# Patient Record
Sex: Female | Born: 1995
Health system: Southern US, Community
[De-identification: ages and names within clinical notes are randomized; demographics above are authoritative.]

## PROBLEM LIST (undated history)

## (undated) DIAGNOSIS — Z9109 Other allergy status, other than to drugs and biological substances: Secondary | ICD-10-CM

## (undated) DIAGNOSIS — T753XXA Motion sickness, initial encounter: Secondary | ICD-10-CM

## (undated) HISTORY — PX: DENTAL SURGERY: SHX609

---
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
## 2017-06-07 ENCOUNTER — Emergency Department
Admission: EM | Admit: 2017-06-07 | Discharge: 2017-06-07 | Disposition: A | Discharge status: Home / Self Care | Payer: Federal, State, Local not specified - PPO | Attending: Emergency Medicine | Admitting: Emergency Medicine

## 2017-06-07 ENCOUNTER — Encounter: Payer: Self-pay | Admitting: *Deleted

## 2017-06-07 DIAGNOSIS — J029 Acute pharyngitis, unspecified: Secondary | ICD-10-CM | POA: Insufficient documentation

## 2017-06-07 MED ORDER — DEXAMETHASONE SODIUM PHOSPHATE 10 MG/ML IJ SOLN
10.0000 mg | Freq: Once | INTRAMUSCULAR | Status: AC
  Administered 2017-06-07: 10 mg via INTRAMUSCULAR
  Filled 2017-06-07: qty 1

## 2017-06-07 MED ORDER — AMOXICILLIN 500 MG PO CAPS
500.0000 mg | ORAL_CAPSULE | Freq: Once | ORAL | Status: AC
  Administered 2017-06-07: 500 mg via ORAL
  Filled 2017-06-07: qty 1

## 2017-06-07 MED ORDER — LIDOCAINE VISCOUS 2 % MT SOLN: 10.0000 mL | OROMUCOSAL | 0 refills | Status: DC | PRN

## 2017-06-07 MED ORDER — AMOXICILLIN 500 MG PO CAPS
500.0000 mg | ORAL_CAPSULE | Freq: Two times a day (BID) | ORAL | 0 refills | Status: DC
Start: 2017-06-07 — End: 2017-10-24

## 2017-06-07 MED ORDER — LIDOCAINE VISCOUS 2 % MT SOLN
15.0000 mL | Freq: Once | OROMUCOSAL | Status: AC
  Administered 2017-06-07: 15 mL via OROMUCOSAL
  Filled 2017-06-07: qty 15

## 2017-06-07 NOTE — ED Notes (Signed)
Pt c/o sore throat for the past 2 days, worse on the right side with muffled voice.. [facility] and was referred to the ed for possible tonsillar abscess..denies any difficulty breathing, pt is able to swallow medications without difficulty..Marland Kitchen

## 2017-06-07 NOTE — ED Provider Notes (Signed)
[facility] Emergency Department Provider Note   ____________________________________________    I have reviewed the triage vital signs and the nursing notes.   HISTORY  Chief Complaint Sore Throat     HPI Kerri Moss is a 21 y.o. female Who presents with moderate burningsore throat.patient reports sore throat developed yesterday, before that she felt quite well. She reports painful swallowing but is able to tolerate by mouth's. Sent from urgent care for evaluation for possible peritonsillar abscess. She has not been on any medications.   History reviewed. No pertinent past medical history.  There are no active problems to display for this patient.   History reviewed. No pertinent surgical history.  Prior to Admission medications   Medication Sig Start Date End Date Taking? Authorizing Provider  amoxicillin (AMOXIL) 500 MG capsule Take 1 capsule (500 mg total) by mouth 2 (two) times daily. 06/07/17   Jene Every, MD  lidocaine (XYLOCAINE) 2 % solution Use as directed 10 mLs in the mouth or throat as needed for mouth pain. 06/07/17   Jene Every, MD     Allergies Patient has no known allergies.  History reviewed. No pertinent family history.  Social History Social History  Substance Use Topics  . Smoking status: Not on file  . Smokeless tobacco: Not on file  . Alcohol use Not on file    Review of Systems  Constitutional: No fever/chills  ENT: sore throat as above   Gastrointestinal: No abdominal pain.  No nausea, no vomiting.    Musculoskeletal: Negative for joint pain Skin: Negative for rash. Neurological: Negative for headaches     ____________________________________________   PHYSICAL EXAM:  VITAL SIGNS: ED Triage Vitals  Enc Vitals Group     BP 06/07/17 1004 140/71     Pulse Rate 06/07/17 1004 (!) 106     Resp 06/07/17 1004 18     Temp 06/07/17 1004 100.2 F (37.9 C)     Temp Source 06/07/17 1004 Oral     SpO2 06/07/17 1004 100 %     Weight 06/07/17 1004 72.6 kg (160 lb)     Height 06/07/17 1004 1.676 m ( )     Head Circumference --      Peak Flow --      Pain Score 06/07/17 1003 8     Pain Loc --      Pain Edu? --      Excl. in GC? --     Constitutional: Alert and oriented. No acute distress. Pleasant and interactive Eyes: Conjunctivae are normal.  Head: Atraumatic. Nose: No congestion/rhinnorhea. Mouth/Throat: Mucous membranes are moist.  Pharynx with bilateral tonsillar swelling, no evidence of PTA, some exudate on the right tonsil especially. No evidence of peritonsillar cellulitis Cardiovascular: Normal rate, regular rhythm.  Respiratory: Normal respiratory effort.  No retractions. Genitourinary: deferred Musculoskeletal: No lower extremity tenderness nor edema.   Neurologic:  Normal speech and language. No gross focal neurologic deficits are appreciated.   Skin:  Skin is warm, dry and intact. No rash noted.   ____________________________________________   LABS (all labs ordered are listed, but only abnormal results are displayed)  Labs Reviewed - No data to display ____________________________________________  EKG   ____________________________________________  RADIOLOGY  None ____________________________________________   PROCEDURES  Procedure(s) performed:No    Critical Care performed: No ____________________________________________   INITIAL IMPRESSION / ASSESSMENT AND PLAN / ED COURSE  Pertinent labs & imaging results that were available during my care of the patient  were reviewed by me and considered in my medical decision making (see chart for details).  Patient presents with sore throat most consistent with pharyngitis. Suspect strep throat, patient reports urgent care attempted to swab her throat and it was very painful for her. She does not want it done again. We will treat presumptively. IM Decadron and viscous lidocaine for  pain   ____________________________________________   FINAL CLINICAL IMPRESSION(S) / ED DIAGNOSES  Final diagnoses:  Pharyngitis, unspecified etiology      NEW MEDICATIONS STARTED DURING THIS VISIT:  New Prescriptions   AMOXICILLIN (AMOXIL) 500 MG CAPSULE    Take 1 capsule (500 mg total) by mouth 2 (two) times daily.   LIDOCAINE (XYLOCAINE) 2 % SOLUTION    Use as directed 10 mLs in the mouth or throat as needed for mouth pain.     Note:  This document was prepared using Dragon voice recognition software and may include unintentional dictation errors.    Jene EveryKinner, Vasilios Ottaway, MD 06/07/17 1032

## 2017-06-07 NOTE — ED Triage Notes (Signed)
[facility], full voice noted, pt able to swallow secretions, in no distress

## 2017-07-29 NOTE — Discharge Instructions (Signed)
T & A INSTRUCTION SHEET - [facility] °[facility] ° °CREIGHTON VAUGHT, MD °PAUL H. JUENGEL, MD  °[facility] °CHAPMAN MCQUEEN, MD ° °[address] [place], [place] [address] TEL. [phone] °[address] [place] [address] [phone] ° °INFORMATION SHEET FOR A TONSILLECTOMY AND ADENDOIDECTOMY ° °About Your Tonsils and Adenoids ° The tonsils and adenoids are normal body tissues that are part of our immune system.  They normally help to protect us against diseases that may enter our mouth and nose.  However, sometimes the tonsils and/or adenoids become too large and obstruct our breathing, especially at night. °  ° If either of these things happen it helps to remove the tonsils and adenoids in order to become healthier. The operation to remove the tonsils and adenoids is called a tonsillectomy and adenoidectomy. ° °The Location of Your Tonsils and Adenoids ° The tonsils are located in the back of the throat on both side and sit in a cradle of muscles. The adenoids are located in the roof of the mouth, behind the nose, and closely associated with the opening of the Eustachian tube to the ear. ° °Surgery on Tonsils and Adenoids ° A tonsillectomy and adenoidectomy is a short operation which takes about thirty minutes.  This includes being put to sleep and being awakened.  Tonsillectomies and adenoidectomies are performed at [facility] and may require observation period in the recovery room prior to going home. ° °Following the Operation for a Tonsillectomy ° A cautery machine is used to control bleeding.  Bleeding from a tonsillectomy and adenoidectomy is minimal and postoperatively the risk of bleeding is approximately four percent, although this rarely life threatening. ° ° ° °After your tonsillectomy and adenoidectomy post-op care at home: ° °1. Our patients are able to go home the same day.  You may be given prescriptions for pain  medications and antibiotics, if indicated. °2. It is extremely important to remember that fluid intake is of utmost importance after a tonsillectomy.  The amount that you drink must be maintained in the postoperative period.  A good indication of whether a child is getting enough fluid is whether his/her urine output is constant.  As long as children are urinating or wetting their diaper every 6 - 8 hours this is usually enough fluid intake.   °3. Although rare, this is a risk of some bleeding in the first ten days after surgery.  This is usually occurs between day five and nine postoperatively.  This risk of bleeding is approximately four percent.  If you or your child should have any bleeding you should remain calm and notify our office or go directly to the Emergency Room at [facility] where they will contact us. Our doctors are available seven days a week for notification.  We recommend sitting up quietly in a chair, place an ice pack on the front of the neck and spitting out the blood gently until we are able to contact you.  Adults should gargle gently with ice water and this may help stop the bleeding.  If the bleeding does not stop after a short time, i.e. 10 to 15 minutes, or seems to be increasing again, please contact us or go to the hospital.   °4. It is common for the pain to be worse at 5 - 7 days postoperatively.  This occurs because the “scab” is peeling off and the mucous membrane (skin of   the throat) is growing back where the tonsils were.   °5. It is common for a low-grade fever, less than 102, during the first week after a tonsillectomy and adenoidectomy.  It is usually due to not drinking enough liquids, and we suggest your use liquid Tylenol or the pain medicine with Tylenol prescribed in order to keep your temperature below 102.  Please follow the directions on the back of the bottle. °6. Do not take aspirin or any products that contain aspirin such as Bufferin, Anacin,  Ecotrin, aspirin gum, Goodies, BC headache powders, etc., after a T&A because it can promote bleeding.  Please check with our office before administering any other medication that may been prescribed by other doctors during the two week post-operative period. °7. If you happen to look in the mirror or into your child’s mouth you will see white/gray patches on the back of the throat.  This is what a scab looks like in the mouth and is normal after having a T&A.  It will disappear once the tonsil area heals completely. However, it may cause a noticeable odor, and this too will disappear with time.     °8. You or your child may experience ear pain after having a T&A.  This is called referred pain and comes from the throat, but it is felt in the ears.  Ear pain is quite common and expected.  It will usually go away after ten days.  There is usually nothing wrong with the ears, and it is primarily due to the healing area stimulating the nerve to the ear that runs along the side of the throat.  Use either the prescribed pain medicine or Tylenol as needed.  °9. The throat tissues after a tonsillectomy are obviously sensitive.  Smoking around children who have had a tonsillectomy significantly increases the risk of bleeding.  DO NOT SMOKE!  ° °General Anesthesia, Adult, Care After °These instructions provide you with information about caring for yourself after your procedure. Your health care provider may also give you more specific instructions. Your treatment has been planned according to current medical practices, but problems sometimes occur. Call your health care provider if you have any problems or questions after your procedure. °What can I expect after the procedure? °After the procedure, it is common to have: °· Vomiting. °· A sore throat. °· Mental slowness. ° °It is common to feel: °· Nauseous. °· Cold or shivery. °· Sleepy. °· Tired. °· Sore or achy, even in parts of your body where you did not have  surgery. ° °Follow these instructions at home: °For at least 24 hours after the procedure: °· Do not: °? Participate in activities where you could fall or become injured. °? Drive. °? Use heavy machinery. °? Drink alcohol. °? Take sleeping pills or medicines that cause drowsiness. °? Make important decisions or sign legal documents. °? Take care of children on your own. °· Rest. °Eating and drinking °· If you vomit, drink water, juice, or soup when you can drink without vomiting. °· Drink enough fluid to keep your urine clear or pale yellow. °· Make sure you have little or no nausea before eating solid foods. °· Follow the diet recommended by your health care provider. °General instructions °· Have a responsible adult stay with you until you are awake and alert. °· Return to your normal activities as told by your health care provider. Ask your health care provider what activities are safe for you. °· Take over-the-counter and   prescription medicines only as told by your health care provider. °· If you smoke, do not smoke without supervision. °· Keep all follow-up visits as told by your health care provider. This is important. °Contact a health care provider if: °· You continue to have nausea or vomiting at home, and medicines are not helpful. °· You cannot drink fluids or start eating again. °· You cannot urinate after 8-12 hours. °· You develop a skin rash. °· You have fever. °· You have increasing redness at the site of your procedure. °Get help right away if: °· You have difficulty breathing. °· You have chest pain. °· You have unexpected bleeding. °· You feel that you are having a life-threatening or urgent problem. °This information is not intended to replace advice given to you by your health care provider. Make sure you discuss any questions you have with your health care provider. °Document Released: 12/21/2000 Document Revised: 02/17/2016 Document Reviewed: 08/29/2015 °Elsevier Interactive Patient Education  © 2018 [employer] ° °

## 2017-08-06 ENCOUNTER — Ambulatory Visit: Payer: Federal, State, Local not specified - PPO | Admitting: Anesthesiology

## 2017-08-06 ENCOUNTER — Ambulatory Visit
Admission: RE | Admit: 2017-08-06 | Discharge: 2017-08-06 | Disposition: A | Discharge status: Home / Self Care | Payer: Federal, State, Local not specified - PPO | Source: Ambulatory Visit | Attending: Unknown Physician Specialty | Admitting: Unknown Physician Specialty

## 2017-08-06 ENCOUNTER — Encounter
Admission: RE | Disposition: A | Discharge status: Home / Self Care | Payer: Self-pay | Source: Ambulatory Visit | Attending: Unknown Physician Specialty

## 2017-08-06 DIAGNOSIS — J353 Hypertrophy of tonsils with hypertrophy of adenoids: Secondary | ICD-10-CM | POA: Insufficient documentation

## 2017-08-06 DIAGNOSIS — Z7951 Long term (current) use of inhaled steroids: Secondary | ICD-10-CM | POA: Insufficient documentation

## 2017-08-06 DIAGNOSIS — J351 Hypertrophy of tonsils: Secondary | ICD-10-CM | POA: Diagnosis present

## 2017-08-06 DIAGNOSIS — J039 Acute tonsillitis, unspecified: Secondary | ICD-10-CM | POA: Diagnosis not present

## 2017-08-06 HISTORY — DX: Other allergy status, other than to drugs and biological substances: Z91.09

## 2017-08-06 HISTORY — PX: TONSILLECTOMY AND ADENOIDECTOMY: SHX28

## 2017-08-06 HISTORY — DX: Motion sickness, initial encounter: T75.3XXA

## 2017-08-06 SURGERY — TONSILLECTOMY AND ADENOIDECTOMY
Anesthesia: General

## 2017-08-06 MED ORDER — FENTANYL CITRATE (PF) 100 MCG/2ML IJ SOLN
INTRAMUSCULAR | Status: DC | PRN
  Administered 2017-08-06: 25 ug via INTRAVENOUS
  Administered 2017-08-06: 50 ug via INTRAVENOUS
  Administered 2017-08-06: 100 ug via INTRAVENOUS
  Administered 2017-08-06: 25 ug via INTRAVENOUS

## 2017-08-06 MED ORDER — OXYCODONE HCL 5 MG/5ML PO SOLN
5.0000 mg | Freq: Once | ORAL | Status: AC | PRN
  Administered 2017-08-06: 5 mg via ORAL

## 2017-08-06 MED ORDER — BUPIVACAINE HCL (PF) 0.5 % IJ SOLN
INTRAMUSCULAR | Status: DC | PRN
  Administered 2017-08-06: 10 mL

## 2017-08-06 MED ORDER — ONDANSETRON HCL 4 MG/2ML IJ SOLN
INTRAMUSCULAR | Status: DC | PRN
  Administered 2017-08-06: 4 mg via INTRAVENOUS

## 2017-08-06 MED ORDER — PROPOFOL 10 MG/ML IV BOLUS
INTRAVENOUS | Status: DC | PRN
  Administered 2017-08-06: 150 mg via INTRAVENOUS

## 2017-08-06 MED ORDER — GLYCOPYRROLATE 0.2 MG/ML IJ SOLN
INTRAMUSCULAR | Status: DC | PRN
  Administered 2017-08-06: 0.1 mg via INTRAVENOUS

## 2017-08-06 MED ORDER — MIDAZOLAM HCL 5 MG/5ML IJ SOLN
INTRAMUSCULAR | Status: DC | PRN
  Administered 2017-08-06: 2 mg via INTRAVENOUS

## 2017-08-06 MED ORDER — OXYCODONE HCL 5 MG PO TABS: 5.0000 mg | ORAL_TABLET | Freq: Once | ORAL | Status: AC | PRN

## 2017-08-06 MED ORDER — LIDOCAINE HCL (CARDIAC) 20 MG/ML IV SOLN
INTRAVENOUS | Status: DC | PRN
  Administered 2017-08-06: 50 mg via INTRAVENOUS

## 2017-08-06 MED ORDER — HYDROCODONE-ACETAMINOPHEN 7.5-325 MG/15ML PO SOLN: 15.0000 mL | Freq: Four times a day (QID) | ORAL | 0 refills | Status: DC | PRN

## 2017-08-06 MED ORDER — LACTATED RINGERS IV SOLN
INTRAVENOUS | Status: DC
  Administered 2017-08-06: 09:00:00 via INTRAVENOUS

## 2017-08-06 MED ORDER — SCOPOLAMINE 1 MG/3DAYS TD PT72
1.0000 | MEDICATED_PATCH | TRANSDERMAL | Status: DC
  Administered 2017-08-06: 1.5 mg via TRANSDERMAL

## 2017-08-06 MED ORDER — DEXAMETHASONE SODIUM PHOSPHATE 4 MG/ML IJ SOLN
INTRAMUSCULAR | Status: DC | PRN
  Administered 2017-08-06: 10 mg via INTRAVENOUS

## 2017-08-06 MED ORDER — SUCCINYLCHOLINE CHLORIDE 20 MG/ML IJ SOLN
INTRAMUSCULAR | Status: DC | PRN
  Administered 2017-08-06: 80 mg via INTRAVENOUS

## 2017-08-06 MED ORDER — ACETAMINOPHEN 10 MG/ML IV SOLN
1000.0000 mg | Freq: Once | INTRAVENOUS | Status: AC
  Administered 2017-08-06: 1000 mg via INTRAVENOUS

## 2017-08-06 SURGICAL SUPPLY — 21 items
CANISTER SUCT 1200ML W/VALVE (MISCELLANEOUS) ×2 IMPLANT
CATH RUBBER RED 8F (CATHETERS) ×2 IMPLANT
COAG SUCT 10F 3.5MM HAND CTRL (MISCELLANEOUS) ×2 IMPLANT
DRAPE HEAD BAR (DRAPES) ×2 IMPLANT
ELECT CAUTERY BLADE TIP 2.5 (TIP) ×2
ELECTRODE CAUTERY BLDE TIP 2.5 (TIP) ×1 IMPLANT
GLOVE BIO SURGEON STRL SZ7.5 (GLOVE) ×2 IMPLANT
HANDLE SUCTION POOLE (INSTRUMENTS) ×1 IMPLANT
KIT ROOM TURNOVER OR (KITS) ×2 IMPLANT
NEEDLE HYPO 25GX1X1/2 BEV (NEEDLE) ×2 IMPLANT
NS IRRIG 500ML POUR BTL (IV SOLUTION) ×2 IMPLANT
PACK TONSIL/ADENOIDS (PACKS) ×2 IMPLANT
PAD GROUND ADULT SPLIT (MISCELLANEOUS) ×2 IMPLANT
PENCIL ELECTRO HAND CTR (MISCELLANEOUS) ×2 IMPLANT
SOL ANTI-FOG 6CC FOG-OUT (MISCELLANEOUS) ×1 IMPLANT
SOL FOG-OUT ANTI-FOG 6CC (MISCELLANEOUS) ×1
SPONGE TONSIL 1 RF SGL (DISPOSABLE) ×2 IMPLANT
STRAP BODY AND KNEE 60X3 (MISCELLANEOUS) ×2 IMPLANT
SUCTION POOLE HANDLE (INSTRUMENTS) ×2
SYR 5ML LL (SYRINGE) ×2 IMPLANT
SYRINGE 10CC LL (SYRINGE) IMPLANT

## 2017-08-06 NOTE — Anesthesia Preprocedure Evaluation (Signed)
Anesthesia Evaluation  Patient identified by MRN, date of birth, ID band  Reviewed: NPO status   History of Anesthesia Complications Negative for: history of anesthetic complications  Airway Mallampati: II  TM Distance: >3 FB Neck ROM: full    Dental no notable dental hx.    Pulmonary neg pulmonary ROS,    Pulmonary exam normal        Cardiovascular Exercise Tolerance: Good negative cardio ROS Normal cardiovascular exam     Neuro/Psych negative neurological ROS  negative psych ROS   GI/Hepatic negative GI ROS, Neg liver ROS,   Endo/Other  negative endocrine ROS  Renal/GU negative Renal ROS  negative genitourinary   Musculoskeletal   Abdominal   Peds  Hematology negative hematology ROS (+)   Anesthesia Other Findings Pt unable to remove ear piercings. OR RN will remove after pt asleep in OR.   Reproductive/Obstetrics negative OB ROS                             Anesthesia Physical Anesthesia Plan  ASA: I  Anesthesia Plan: General ETT   Post-op Pain Management:    Induction:   PONV Risk Score and Plan:   Airway Management Planned:   Additional Equipment:   Intra-op Plan:   Post-operative Plan:   Informed Consent: I have reviewed the patients History and Physical, chart, labs and discussed the procedure including the risks, benefits and alternatives for the proposed anesthesia with the patient or authorized representative who has indicated his/her understanding and acceptance.     Plan Discussed with: CRNA  Anesthesia Plan Comments:         Anesthesia Quick Evaluation

## 2017-08-06 NOTE — Anesthesia Procedure Notes (Signed)
Procedure Name: Intubation Date/Time: 08/06/2017 9:56 AM Performed by: Jimmy PicketAmyot, Kloe Oates, CRNA Pre-anesthesia Checklist: Patient identified, Emergency Drugs available, Suction available, Patient being monitored and Timeout performed Patient Re-evaluated:Patient Re-evaluated prior to induction Oxygen Delivery Method: Circle system utilized Preoxygenation: Pre-oxygenation with 100% oxygen Induction Type: IV induction Ventilation: Mask ventilation without difficulty Laryngoscope Size: Miller and 2 Grade View: Grade I Tube type: Oral Rae Tube size: 7.0 mm Number of attempts: 1 Placement Confirmation: ETT inserted through vocal cords under direct vision,  positive ETCO2 and breath sounds checked- equal and bilateral Tube secured with: Tape Dental Injury: Teeth and Oropharynx as per pre-operative assessment

## 2017-08-06 NOTE — H&P (Signed)
The patient's history has been reviewed, patient examined, no change in status, stable for surgery.  Questions were answered to the patients satisfaction.  

## 2017-08-06 NOTE — Transfer of Care (Signed)
Immediate Anesthesia Transfer of Care Note  Patient: Kerri Moss  Procedure(s) Performed: TONSILLECTOMY AND POSSIBLE ADENOIDECTOMY (N/A )  Patient Location: PACU  Anesthesia Type: General ETT  Level of Consciousness: awake, alert  and patient cooperative  Airway and Oxygen Therapy: Patient Spontanous Breathing and Patient connected to supplemental oxygen  Post-op Assessment: Post-op Vital signs reviewed, Patient's Cardiovascular Status Stable, Respiratory Function Stable, Patent Airway and No signs of Nausea or vomiting  Post-op Vital Signs: Reviewed and stable  Complications: No apparent anesthesia complications

## 2017-08-06 NOTE — Op Note (Signed)
PREOPERATIVE DIAGNOSIS:  TONSIL AND ADENOID HYPERTROPHY RAST INHALENTS  POSTOPERATIVE DIAGNOSIS: Same  OPERATION:  Tonsillectomy and adenoidectomy.  SURGEON:  Davina Pokehapman T. Burgess Sheriff, MD  ANESTHESIA:  General endotracheal.  OPERATIVE FINDINGS:  Large tonsils and adenoids.  DESCRIPTION OF THE PROCEDURE:  Kerri Moss was identified in the holding area and taken to the operating room and placed in the supine position.  After general endotracheal anesthesia, the table was turned 45 degrees and the patient was draped in the usual fashion for a tonsillectomy.  A mouth gag was inserted into the oral cavity and examination of the oropharynx showed the uvula was non-bifid.  There was no evidence of submucous cleft to the palate.  There were large tonsils.  A red rubber catheter was placed through the nostril.  Examination of the nasopharynx showed large obstructing adenoids.  Under indirect vision with the mirror, an adenotome was placed in the nasopharynx.  The adenoids were curetted free.  Reinspection with a mirror showed excellent removal of the adenoid.  Nasopharyngeal packs were then placed.  The operation then turned to the tonsillectomy.  Beginning on the left-hand side a tenaculum was used to grasp the tonsil and the Bovie cautery was used to dissect it free from the fossa.  In a similar fashion, the right tonsil was removed.  Meticulous hemostasis was achieved using the Bovie cautery.  With both tonsils removed and no active bleeding, the nasopharyngeal packs were removed.  Suction cautery was then used to cauterize the nasopharyngeal bed to prevent bleeding.  The red rubber catheter was removed with no active bleeding.  0.5% plain Marcaine was used to inject the anterior and posterior tonsillar pillars bilaterally.  A total of 10ml was used.  The patient tolerated the procedure well and was awakened in the operating room and taken to the recovery room in stable condition.   CULTURES:   None.  SPECIMENS:  Tonsils and adenoids.  ESTIMATED BLOOD LOSS:  Less than 20 ml.  Kerri Moss T  08/06/2017  10:22 AM

## 2017-08-06 NOTE — Anesthesia Postprocedure Evaluation (Signed)
Anesthesia Post Note  Patient: Kerri Moss  Procedure(s) Performed: TONSILLECTOMY AND POSSIBLE ADENOIDECTOMY (N/A )  Patient location during evaluation: PACU Anesthesia Type: General Level of consciousness: awake and alert Pain management: pain level controlled Vital Signs Assessment: post-procedure vital signs reviewed and stable Respiratory status: spontaneous breathing, nonlabored ventilation, respiratory function stable and patient connected to nasal cannula oxygen Cardiovascular status: blood pressure returned to baseline and stable Postop Assessment: no apparent nausea or vomiting Anesthetic complications: no    Gretchen Weinfeld

## 2017-08-09 ENCOUNTER — Encounter: Payer: Self-pay | Admitting: Unknown Physician Specialty

## 2017-08-10 LAB — SURGICAL PATHOLOGY

## 2017-10-24 ENCOUNTER — Encounter (HOSPITAL_COMMUNITY): Payer: Self-pay | Admitting: Emergency Medicine

## 2017-10-24 ENCOUNTER — Ambulatory Visit (HOSPITAL_COMMUNITY)
Admission: EM | Admit: 2017-10-24 | Discharge: 2017-10-24 | Disposition: A | Discharge status: Home / Self Care | Payer: Federal, State, Local not specified - PPO | Attending: Family Medicine | Admitting: Family Medicine

## 2017-10-24 DIAGNOSIS — J0100 Acute maxillary sinusitis, unspecified: Secondary | ICD-10-CM

## 2017-10-24 MED ORDER — FLUTICASONE PROPIONATE 50 MCG/ACT NA SUSP: 2.0000 | Freq: Every day | NASAL | 2 refills | Status: DC

## 2017-10-24 MED ORDER — AMOXICILLIN-POT CLAVULANATE 875-125 MG PO TABS: 1.0000 | ORAL_TABLET | Freq: Two times a day (BID) | ORAL | 0 refills | Status: AC

## 2017-10-24 NOTE — ED Triage Notes (Signed)
PT reports head pain and pressure with congestion. PT reports history of sinus infections. PT reports this is worse than any sinus infection she's ever had.

## 2017-10-24 NOTE — ED Provider Notes (Signed)
[facility]    CSN: [id number] Arrival date & time: 10/24/17  1819     History   Chief Complaint Chief Complaint  Patient presents with  . Facial Pain    HPI Kerri Moss is a 22 y.o. female.   Presents today for possible sinus infection. Reports 1 week duration of congestion, running nose, sneezing, sore throat, mild coughing, headache and new onset of right facial pain onset today. Reports mild fever at home. States that she gets sinus infection often.       Past Medical History:  Diagnosis Date  . Car sickness   . Environmental allergies     There are no active problems to display for this patient.   Past Surgical History:  Procedure Laterality Date  . DENTAL SURGERY    . TONSILLECTOMY AND ADENOIDECTOMY N/A 08/06/2017   Procedure: TONSILLECTOMY AND POSSIBLE ADENOIDECTOMY;  Surgeon: Linus Salmons, MD;  Location: [facility];  Service: ENT;  Laterality: N/A;    OB History    No data available       Home Medications    Prior to Admission medications   Medication Sig Start Date End Date Taking? Authorizing Provider  amoxicillin-clavulanate (AUGMENTIN) 875-125 MG tablet Take 1 tablet by mouth 2 (two) times daily for 7 days. 10/24/17 10/31/17  Lucia Estelle, NP  fexofenadine (ALLEGRA) 30 MG tablet Take 30 mg by mouth daily.    [provider]  fluticasone (FLONASE) 50 MCG/ACT nasal spray Place 2 sprays into both nostrils daily for 7 days. 10/24/17 10/31/17  Lucia Estelle, NP    Family History No family history on file.  Social History Social History   Tobacco Use  . Smoking status: Never Smoker  . Smokeless tobacco: Never Used  Substance Use Topics  . Alcohol use: Yes    Comment: rare  . Drug use: Not on file     Allergies   Patient has no known allergies.   Review of Systems Review of Systems  Constitutional: Positive for fever (Subjective fever). Negative for chills and fatigue.  HENT: Positive for congestion,  rhinorrhea, sinus pressure, sinus pain, sneezing and sore throat.   Respiratory: Positive for cough (Mild). Negative for shortness of breath and wheezing.   Cardiovascular: Negative for chest pain and palpitations.  Gastrointestinal: Negative for abdominal pain, nausea and vomiting.  Skin: Negative for rash.  Neurological: Positive for headaches. Negative for dizziness.     Physical Exam Triage Vital Signs ED Triage Vitals  Enc Vitals Group     BP 10/24/17 1852 132/71     Pulse Rate 10/24/17 1852 79     Resp 10/24/17 1852 16     Temp 10/24/17 1852 97.7 F (36.5 C)     Temp Source 10/24/17 1852 Oral     SpO2 10/24/17 1852 98 %     Weight 10/24/17 1849 165 lb (74.8 kg)     Height 10/24/17 1849 5\' 6"  (1.676 m)     Head Circumference --      Peak Flow --      Pain Score 10/24/17 1848 4     Pain Loc --      Pain Edu? --      Excl. in GC? --    No data found.  Updated Vital Signs BP 132/71   Pulse 79   Temp 97.7 F (36.5 C) (Oral)   Resp 16   Ht 5\' 6"  (1.676 m)   Wt 165 lb (74.8 kg)  LMP 10/03/2017   SpO2 98%   BMI 26.63 kg/m   Physical Exam  Constitutional: She is oriented to person, place, and time. She appears well-developed and well-nourished.  HENT:  Head: Normocephalic and atraumatic.  Right Ear: External ear normal.  Left Ear: External ear normal.  Nose: Nose normal.  Mouth/Throat: Oropharynx is clear and moist. No oropharyngeal exudate.  TM pearly gray with no erythema. Right maxillary sinuses are tender to percuss   Eyes: Conjunctivae are normal. Pupils are equal, round, and reactive to light.  Neck: Normal range of motion. Neck supple.  Cardiovascular: Normal rate, regular rhythm and normal heart sounds.  Pulmonary/Chest: Effort normal and breath sounds normal. She has no wheezes.  Abdominal: Soft. Bowel sounds are normal. There is no tenderness.  Lymphadenopathy:    She has no cervical adenopathy.  Neurological: She is alert and oriented to person,  place, and time.  Skin: Skin is warm and dry.  Psychiatric: She has a normal mood and affect.  Nursing note and vitals reviewed.    UC Treatments / Results  Labs (all labs ordered are listed, but only abnormal results are displayed) Labs Reviewed - No data to display  EKG  EKG Interpretation None      Radiology No results found.  Procedures Procedures (including critical care time)  Medications Ordered in UC Medications - No data to display   Initial Impression / Assessment and Plan / UC Course  I have reviewed the triage vital signs and the nursing notes.  Pertinent labs & imaging results that were available during my care of the patient were reviewed by me and considered in my medical decision making (see chart for details).  Final Clinical Impressions(s) / UC Diagnoses   Final diagnoses:  Acute non-recurrent maxillary sinusitis   Prescriptions given (see below). Reviewed directions for usage and side effects. Patient states understanding and will call with questions or problems. Patient instructed to call or follow up with his/her primary care doctor if failure to improve or change in symptoms. Discharge instruction given.    ED Discharge Orders        Ordered    amoxicillin-clavulanate (AUGMENTIN) 875-125 MG tablet  2 times daily     10/24/17 1905    fluticasone (FLONASE) 50 MCG/ACT nasal spray  Daily     10/24/17 1905     Controlled Substance Prescriptions [place] Controlled Substance Registry consulted? Not Applicable   Lucia EstelleZheng, Aydee Mcnew, NP 10/24/17 1905

## 2020-04-23 ENCOUNTER — Emergency Department (HOSPITAL_COMMUNITY): Payer: Federal, State, Local not specified - PPO

## 2020-04-23 ENCOUNTER — Observation Stay (HOSPITAL_COMMUNITY): Payer: Federal, State, Local not specified - PPO

## 2020-04-23 ENCOUNTER — Other Ambulatory Visit: Payer: Self-pay

## 2020-04-23 ENCOUNTER — Observation Stay (HOSPITAL_COMMUNITY)
Admission: EM | Admit: 2020-04-23 | Discharge: 2020-04-24 | Disposition: A | Discharge status: Home / Self Care | Payer: Federal, State, Local not specified - PPO | Attending: Neurosurgery | Admitting: Neurosurgery

## 2020-04-23 ENCOUNTER — Encounter (HOSPITAL_COMMUNITY): Payer: Self-pay

## 2020-04-23 DIAGNOSIS — S0219XB Other fracture of base of skull, initial encounter for open fracture: Secondary | ICD-10-CM | POA: Diagnosis present

## 2020-04-23 DIAGNOSIS — S0219XA Other fracture of base of skull, initial encounter for closed fracture: Secondary | ICD-10-CM | POA: Diagnosis not present

## 2020-04-23 DIAGNOSIS — Y9352 Activity, horseback riding: Secondary | ICD-10-CM | POA: Diagnosis not present

## 2020-04-23 DIAGNOSIS — G9389 Other specified disorders of brain: Secondary | ICD-10-CM | POA: Diagnosis not present

## 2020-04-23 DIAGNOSIS — W5512XA Struck by horse, initial encounter: Secondary | ICD-10-CM | POA: Insufficient documentation

## 2020-04-23 DIAGNOSIS — S069X0A Unspecified intracranial injury without loss of consciousness, initial encounter: Secondary | ICD-10-CM | POA: Diagnosis not present

## 2020-04-23 DIAGNOSIS — S0101XA Laceration without foreign body of scalp, initial encounter: Secondary | ICD-10-CM | POA: Insufficient documentation

## 2020-04-23 DIAGNOSIS — S0291XB Unspecified fracture of skull, initial encounter for open fracture: Secondary | ICD-10-CM | POA: Diagnosis not present

## 2020-04-23 DIAGNOSIS — S06300A Unspecified focal traumatic brain injury without loss of consciousness, initial encounter: Secondary | ICD-10-CM | POA: Diagnosis present

## 2020-04-23 DIAGNOSIS — S199XXA Unspecified injury of neck, initial encounter: Secondary | ICD-10-CM | POA: Diagnosis not present

## 2020-04-23 DIAGNOSIS — Z20822 Contact with and (suspected) exposure to covid-19: Secondary | ICD-10-CM | POA: Diagnosis not present

## 2020-04-23 DIAGNOSIS — F329 Major depressive disorder, single episode, unspecified: Secondary | ICD-10-CM | POA: Diagnosis not present

## 2020-04-23 DIAGNOSIS — S06360A Traumatic hemorrhage of cerebrum, unspecified, without loss of consciousness, initial encounter: Secondary | ICD-10-CM | POA: Diagnosis not present

## 2020-04-23 DIAGNOSIS — Y999 Unspecified external cause status: Secondary | ICD-10-CM | POA: Insufficient documentation

## 2020-04-23 DIAGNOSIS — S0990XA Unspecified injury of head, initial encounter: Secondary | ICD-10-CM | POA: Diagnosis not present

## 2020-04-23 DIAGNOSIS — T797XXA Traumatic subcutaneous emphysema, initial encounter: Secondary | ICD-10-CM | POA: Diagnosis not present

## 2020-04-23 DIAGNOSIS — Z23 Encounter for immunization: Secondary | ICD-10-CM | POA: Diagnosis not present

## 2020-04-23 DIAGNOSIS — S065X0A Traumatic subdural hemorrhage without loss of consciousness, initial encounter: Secondary | ICD-10-CM | POA: Diagnosis not present

## 2020-04-23 DIAGNOSIS — Y929 Unspecified place or not applicable: Secondary | ICD-10-CM | POA: Diagnosis not present

## 2020-04-23 DIAGNOSIS — I6200 Nontraumatic subdural hemorrhage, unspecified: Secondary | ICD-10-CM | POA: Diagnosis not present

## 2020-04-23 DIAGNOSIS — S0102XA Laceration with foreign body of scalp, initial encounter: Secondary | ICD-10-CM | POA: Diagnosis not present

## 2020-04-23 LAB — BASIC METABOLIC PANEL
Anion gap: 9 (ref 5–15)
BUN: 9 mg/dL (ref 6–20)
CO2: 23 mmol/L (ref 22–32)
Calcium: 9 mg/dL (ref 8.9–10.3)
Chloride: 105 mmol/L (ref 98–111)
Creatinine, Ser: 0.93 mg/dL (ref 0.44–1.00)
GFR calc Af Amer: >60 mL/min (ref >60)
GFR calc non Af Amer: >60 mL/min (ref >60)
Glucose, Bld: 117 mg/dL — ABNORMAL HIGH (ref 70–99)
Potassium: 3.4 mmol/L — ABNORMAL LOW (ref 3.5–5.1)
Sodium: 137 mmol/L (ref 135–145)

## 2020-04-23 LAB — SARS CORONAVIRUS 2 BY RT PCR (HOSPITAL ORDER, PERFORMED IN ~~LOC~~ HOSPITAL LAB): SARS Coronavirus 2: NEGATIVE

## 2020-04-23 LAB — CBC WITH DIFFERENTIAL/PLATELET
Abs Immature Granulocytes: 0.07 10*3/uL (ref 0.00–0.07)
Basophils Absolute: 0.1 10*3/uL (ref 0.0–0.1)
Basophils Relative: 1 %
Eosinophils Absolute: 0.2 10*3/uL (ref 0.0–0.5)
Eosinophils Relative: 1 %
HCT: 39.9 % (ref 36.0–46.0)
Hemoglobin: 12.6 g/dL (ref 12.0–15.0)
Immature Granulocytes: 0 %
Lymphocytes Relative: 18 %
Lymphs Abs: 2.9 10*3/uL (ref 0.7–4.0)
MCH: 28.4 pg (ref 26.0–34.0)
MCHC: 31.6 g/dL (ref 30.0–36.0)
MCV: 89.9 fL (ref 80.0–100.0)
Monocytes Absolute: 0.8 10*3/uL (ref 0.1–1.0)
Monocytes Relative: 5 %
Neutro Abs: 12.5 10*3/uL — ABNORMAL HIGH (ref 1.7–7.7)
Neutrophils Relative %: 75 %
Platelets: 217 10*3/uL (ref 150–400)
RBC: 4.44 MIL/uL (ref 3.87–5.11)
RDW: 14 % (ref 11.5–15.5)
WBC: 16.5 10*3/uL — ABNORMAL HIGH (ref 4.0–10.5)
nRBC: 0 % (ref 0.0–0.2)

## 2020-04-23 LAB — HIV ANTIBODY (ROUTINE TESTING W REFLEX): HIV Screen 4th Generation wRfx: NONREACTIVE

## 2020-04-23 LAB — I-STAT BETA HCG BLOOD, ED (MC, WL, AP ONLY): I-stat hCG, quantitative: <5 m[IU]/mL (ref <5)

## 2020-04-23 MED ORDER — BISACODYL 10 MG RE SUPP: 10.0000 mg | Freq: Every day | RECTAL | Status: DC | PRN

## 2020-04-23 MED ORDER — METOPROLOL TARTRATE 5 MG/5ML IV SOLN: 5.0000 mg | Freq: Four times a day (QID) | INTRAVENOUS | Status: DC | PRN

## 2020-04-23 MED ORDER — OXYCODONE HCL 5 MG PO TABS: 5.0000 mg | ORAL_TABLET | ORAL | Status: DC | PRN

## 2020-04-23 MED ORDER — ONDANSETRON HCL 4 MG/2ML IJ SOLN: 4.0000 mg | Freq: Four times a day (QID) | INTRAMUSCULAR | Status: DC | PRN

## 2020-04-23 MED ORDER — ACETAMINOPHEN 325 MG PO TABS
650.0000 mg | ORAL_TABLET | Freq: Four times a day (QID) | ORAL | Status: DC | PRN
  Administered 2020-04-23 – 2020-04-24 (×3): 650 mg via ORAL
  Filled 2020-04-23 (×3): qty 2

## 2020-04-23 MED ORDER — LEVETIRACETAM 500 MG PO TABS
500.0000 mg | ORAL_TABLET | Freq: Two times a day (BID) | ORAL | Status: DC
  Administered 2020-04-23: 500 mg via ORAL
  Filled 2020-04-23: qty 1

## 2020-04-23 MED ORDER — ONDANSETRON HCL 4 MG PO TABS: 4.0000 mg | ORAL_TABLET | Freq: Four times a day (QID) | ORAL | Status: DC | PRN

## 2020-04-23 MED ORDER — CEPHALEXIN 500 MG PO CAPS
500.0000 mg | ORAL_CAPSULE | Freq: Three times a day (TID) | ORAL | Status: DC
  Administered 2020-04-23 – 2020-04-24 (×2): 500 mg via ORAL
  Filled 2020-04-23: qty 2
  Filled 2020-04-23: qty 1

## 2020-04-23 MED ORDER — SODIUM CHLORIDE 0.9 % IV SOLN: INTRAVENOUS | Status: DC

## 2020-04-23 MED ORDER — DOCUSATE SODIUM 100 MG PO CAPS
100.0000 mg | ORAL_CAPSULE | Freq: Two times a day (BID) | ORAL | Status: DC
  Administered 2020-04-23: 100 mg via ORAL
  Filled 2020-04-23: qty 1

## 2020-04-23 MED ORDER — TETANUS-DIPHTH-ACELL PERTUSSIS 5-2.5-18.5 LF-MCG/0.5 IM SUSP
0.5000 mL | Freq: Once | INTRAMUSCULAR | Status: AC
  Administered 2020-04-23: 0.5 mL via INTRAMUSCULAR
  Filled 2020-04-23: qty 0.5

## 2020-04-23 MED ORDER — POLYETHYLENE GLYCOL 3350 17 G PO PACK: 17.0000 g | PACK | Freq: Every day | ORAL | Status: DC | PRN

## 2020-04-23 MED ORDER — LIDOCAINE-EPINEPHRINE 1 %-1:100000 IJ SOLN
10.0000 mL | Freq: Once | INTRAMUSCULAR | Status: AC
  Administered 2020-04-23: 10 mL
  Filled 2020-04-23: qty 1

## 2020-04-23 MED ORDER — ACETAMINOPHEN 650 MG RE SUPP: 650.0000 mg | Freq: Four times a day (QID) | RECTAL | Status: DC | PRN

## 2020-04-23 NOTE — ED Provider Notes (Signed)
[facility] Note   CSN: [id number] Arrival date & time: 04/23/20  1134     History No chief complaint on file.   Kerri Moss is a 24 y.o. female with no pertinent past medical history who presents today for evaluation after a horse stepped on her head.  She reports that she was out riding a horse and the horse got spooked and fell.  When it went to stand up it put 1 of its front feet on the right side of her head.  She denies loss of consciousness.  She reports mild headache at this time.  No vision changes.  She denies nausea or vomiting.  Does not take any blood thinning medications.  She denies any other injuries.  This happened at about 10 AM.  She does not know when her last tetanus shot was.  Her pain is made worse with touch and movement.  Made better with being still.  She denies any weakness, vision changes, or changes in sensation.    HPI     Past Medical History:  Diagnosis Date  . Car sickness   . Environmental allergies     Patient Active Problem List   Diagnosis Date Noted  . Traumatic intracranial hemorrhage without loss of consciousness (HCC) 04/23/2020  . Open fracture of temporal bone (HCC) 04/23/2020  . Pneumocephalus, traumatic 04/23/2020    Past Surgical History:  Procedure Laterality Date  . DENTAL SURGERY    . TONSILLECTOMY AND ADENOIDECTOMY N/A 08/06/2017   Procedure: TONSILLECTOMY AND POSSIBLE ADENOIDECTOMY;  Surgeon: Linus SalmonsMcQueen, Chapman, MD;  [facility];  Service: ENT;  Laterality: N/A;     OB History   No obstetric history on file.     No family history on file.  Social History   Tobacco Use  . Smoking status: Never Smoker  . Smokeless tobacco: Never Used  Substance Use Topics  . Alcohol use: Yes    Comment: rare  . Drug use: Not on file    Home Medications Prior to Admission medications   Medication Sig Start Date End Date Taking? Authorizing Provider  cetirizine  (ZYRTEC) 10 MG tablet Take 10 mg by mouth daily as needed for allergies or rhinitis.  12/08/19  Yes [provider]  fluticasone (FLONASE) 50 MCG/ACT nasal spray Place 2 sprays into both nostrils daily for 7 days. Patient not taking: Reported on 04/23/2020 10/24/17 04/23/20  Lucia EstelleZheng, Feng, NP    Allergies    Other  Review of Systems   Review of Systems  Constitutional: Negative for chills and fever.  HENT: Negative for congestion.   Eyes: Negative for visual disturbance.  Respiratory: Negative for chest tightness and shortness of breath.   Cardiovascular: Negative for chest pain.  Gastrointestinal: Negative for abdominal pain.  Musculoskeletal: Negative for back pain and neck pain.  Skin: Positive for wound.  Neurological: Positive for headaches. Negative for seizures, speech difficulty and weakness.  All other systems reviewed and are negative.   Physical Exam Updated Vital Signs BP 121/69 (BP Location: Left Arm)   Pulse 71   Temp 98.7 F (37.1 C) (Oral)   Resp 14   Ht 5\' 7"  (1.702 m)   Wt 77.1 kg   SpO2 100%   BMI 26.63 kg/m   Physical Exam Vitals and nursing note reviewed.  Constitutional:      General: She is not in acute distress.    Appearance: She is well-developed. She is not diaphoretic.  Comments: When I walked in the room patient was drinking fluids  HENT:     Head: Normocephalic and atraumatic.     Comments: There is a 5cm laceration behind the right ear.  No active bleeding.  Eyes:     General: No scleral icterus.       Right eye: No discharge.        Left eye: No discharge.     Conjunctiva/sclera: Conjunctivae normal.  Cardiovascular:     Rate and Rhythm: Normal rate and regular rhythm.     Pulses: Normal pulses.     Heart sounds: Normal heart sounds.  Pulmonary:     Effort: Pulmonary effort is normal. No respiratory distress.     Breath sounds: No stridor.  Abdominal:     General: There is no distension.     Tenderness: There is no  abdominal tenderness.  Musculoskeletal:        General: No deformity.     Cervical back: Normal range of motion and neck supple. No rigidity.  Skin:    General: Skin is warm and dry.  Neurological:     General: No focal deficit present.     Mental Status: She is alert and oriented to person, place, and time. Mental status is at baseline.     Cranial Nerves: No cranial nerve deficit.     Sensory: No sensory deficit.     Motor: No abnormal muscle tone.  Psychiatric:        Mood and Affect: Mood normal.        Behavior: Behavior normal.         ED Results / Procedures / Treatments   Labs (all labs ordered are listed, but only abnormal results are displayed) Labs Reviewed  BASIC METABOLIC PANEL - Abnormal; Notable for the following components:      Result Value   Potassium 3.4 (*)    Glucose, Bld 117 (*)    All other components within normal limits  CBC WITH DIFFERENTIAL/PLATELET - Abnormal; Notable for the following components:   WBC 16.5 (*)    Neutro Abs 12.5 (*)    All other components within normal limits  SARS CORONAVIRUS 2 BY RT PCR (HOSPITAL ORDER, PERFORMED IN [facility])  I-STAT BETA HCG BLOOD, ED ([facility], [facility], [facility] ONLY)    EKG None  Radiology CT Head Wo Contrast  Result Date: 04/23/2020 CLINICAL DATA:  Head trauma, focal neuro findings. Poly trauma, critical, head/cervical spine injury suspected. Additional history provided: Fall off horse. EXAM: CT HEAD WITHOUT CONTRAST CT CERVICAL SPINE WITHOUT CONTRAST TECHNIQUE: Multidetector CT imaging of the head and cervical spine was performed following the standard protocol without intravenous contrast. Multiplanar CT image reconstructions of the cervical spine were also generated. COMPARISON:  No pertinent prior exams are available for comparison. FINDINGS: CT HEAD FINDINGS Brain: There is thin acute extra-axial hematoma and a small amount of extra-axial pneumocephalus overlying the lateral right temporoparietal  lobes. The hemorrhage may be subdural or epidural and measures up to 3 mm in greatest thickness (for instance as seen on series 1, image 15). There is an overlying mildly depressed fracture of the squamosal right temporal bone (for instance as seen on series 5, image 38). Cerebral volume is normal. No demarcated cortical infarct. No evidence of intracranial mass. No midline shift. Vascular: No hyperdense vessel. Skull: Mildly depressed acute fracture of the squamosal right temporal bone. Sinuses/Orbits: Visualized orbits show no acute finding. No significant paranasal sinus disease or  mastoid effusion. Other: Subcutaneous gas and soft tissue swelling on the right, overlying the right temporal bone fracture. CT CERVICAL SPINE FINDINGS Alignment: Mild nonspecific reversal of the expected cervical lordosis. Skull base and vertebrae: The basion-dental and atlanto-dental intervals are maintained.No evidence of acute fracture to the cervical spine. Soft tissues and spinal canal: No prevertebral fluid or swelling. No visible canal hematoma. Disc levels: No significant bony spinal canal or neural foraminal narrowing at any level. Upper chest: No consolidation within the imaged lung apices. No visible pneumothorax. These results were called by telephone at the time of interpretation on 04/23/2020 at 3:54 pm to provider Dr. Clarice Pole, who verbally acknowledged these results. IMPRESSION: CT head: There is small-volume acute extra-axial hemorrhage and small amount of pneumocephalus overlying the right temporoparietal lobes. The hemorrhage measures up to 3 mm in thickness and may be subdural or epidural in location. Overlying mildly depressed acute fracture of the squamosal right temporal bone. Overlying scalp soft tissue swelling and subcutaneous gas. CT cervical spine: 1. No evidence of acute fracture to the cervical spine. 2. Nonspecific reversal of the expected cervical lordosis. Electronically Signed   By: Jackey Loge DO    On: 04/23/2020 15:55   CT Cervical Spine Wo Contrast  Result Date: 04/23/2020 CLINICAL DATA:  Head trauma, focal neuro findings. Poly trauma, critical, head/cervical spine injury suspected. Additional history provided: Fall off horse. EXAM: CT HEAD WITHOUT CONTRAST CT CERVICAL SPINE WITHOUT CONTRAST TECHNIQUE: Multidetector CT imaging of the head and cervical spine was performed following the standard protocol without intravenous contrast. Multiplanar CT image reconstructions of the cervical spine were also generated. COMPARISON:  No pertinent prior exams are available for comparison. FINDINGS: CT HEAD FINDINGS Brain: There is thin acute extra-axial hematoma and a small amount of extra-axial pneumocephalus overlying the lateral right temporoparietal lobes. The hemorrhage may be subdural or epidural and measures up to 3 mm in greatest thickness (for instance as seen on series 1, image 15). There is an overlying mildly depressed fracture of the squamosal right temporal bone (for instance as seen on series 5, image 38). Cerebral volume is normal. No demarcated cortical infarct. No evidence of intracranial mass. No midline shift. Vascular: No hyperdense vessel. Skull: Mildly depressed acute fracture of the squamosal right temporal bone. Sinuses/Orbits: Visualized orbits show no acute finding. No significant paranasal sinus disease or mastoid effusion. Other: Subcutaneous gas and soft tissue swelling on the right, overlying the right temporal bone fracture. CT CERVICAL SPINE FINDINGS Alignment: Mild nonspecific reversal of the expected cervical lordosis. Skull base and vertebrae: The basion-dental and atlanto-dental intervals are maintained.No evidence of acute fracture to the cervical spine. Soft tissues and spinal canal: No prevertebral fluid or swelling. No visible canal hematoma. Disc levels: No significant bony spinal canal or neural foraminal narrowing at any level. Upper chest: No consolidation within the  imaged lung apices. No visible pneumothorax. These results were called by telephone at the time of interpretation on 04/23/2020 at 3:54 pm to provider Dr. Clarice Pole, who verbally acknowledged these results. IMPRESSION: CT head: There is small-volume acute extra-axial hemorrhage and small amount of pneumocephalus overlying the right temporoparietal lobes. The hemorrhage measures up to 3 mm in thickness and may be subdural or epidural in location. Overlying mildly depressed acute fracture of the squamosal right temporal bone. Overlying scalp soft tissue swelling and subcutaneous gas. CT cervical spine: 1. No evidence of acute fracture to the cervical spine. 2. Nonspecific reversal of the expected cervical lordosis. Electronically Signed   By:  Jackey Loge DO   On: 04/23/2020 15:55    Procedures .Critical Care Performed by: Cristina Gong, PA-C Authorized by: Cristina Gong, PA-C   Critical care provider statement:    Critical care time (minutes):  45   Critical care time was exclusive of:  Separately billable procedures and treating other patients and teaching time   Critical care was necessary to treat or prevent imminent or life-threatening deterioration of the following conditions:  Trauma   Critical care was time spent personally by me on the following activities:  Discussions with consultants, evaluation of patient's response to treatment, examination of patient, ordering and performing treatments and interventions, ordering and review of laboratory studies, ordering and review of radiographic studies, pulse oximetry, re-evaluation of patient's condition, obtaining history from patient or surrogate and review of old charts .Marland KitchenLaceration Repair  Date/Time: 04/23/2020 6:55 PM Performed by: Cristina Gong, PA-C Authorized by: Cristina Gong, PA-C   Consent:    Consent obtained:  Verbal   Consent given by:  Patient   Risks discussed:  Infection, need for additional repair,  poor cosmetic result, pain, retained foreign body, tendon damage, vascular damage, poor wound healing and nerve damage   Alternatives discussed:  No treatment and referral (Alternative wound closures) Anesthesia (see MAR for exact dosages):    Anesthesia method:  Local infiltration   Local anesthetic:  Lidocaine 2% WITH epi Laceration details:    Location:  Scalp   Scalp location:  R temporal   Length (cm):  5 Repair type:    Repair type:  Intermediate Pre-procedure details:    Preparation:  Patient was prepped and draped in usual sterile fashion and imaging obtained to evaluate for foreign bodies Exploration:    Hemostasis achieved with:  Epinephrine and direct pressure   Wound exploration: wound explored through full range of motion and entire depth of wound probed and visualized     Wound extent: foreign bodies/material     Foreign bodies/material:  Dirt   Contaminated: yes   Treatment:    Area cleansed with:  Saline and Betadine   Amount of cleaning:  Extensive   Irrigation solution:  Sterile saline   Irrigation method: Gentle, controlled irrigation to remove dirt and debris with out creating pressure to induce foreign material into skull.  Skin repair:    Repair method:  Staples   Number of staples:  7 Approximation:    Approximation:  Close Post-procedure details:    Dressing:  Open (no dressing)   Patient tolerance of procedure:  Tolerated well, no immediate complications   (including critical care time)  Medications Ordered in ED Medications  lidocaine-EPINEPHrine (XYLOCAINE W/EPI) 1 %-1:100000 (with pres) injection 10 mL (has no administration in time range)  Tdap (BOOSTRIX) injection 0.5 mL (0.5 mLs Intramuscular Given 04/23/20 1624)    ED Course  I have reviewed the triage vital signs and the nursing notes.  Pertinent labs & imaging results that were available during my care of the patient were reviewed by me and considered in my medical decision making (see  chart for details).  Clinical Course as of Apr 23 1717  Tue Apr 23, 2020  1631 I spoke with Suanne Marker of neurosurgery, he will be by to admit patient for obs, requests I close the skin laceration.    [EH]    Clinical Course User Index [EH] Norman Clay   MDM Rules/Calculators/A&P  Patient is a healthy 24 year old woman who presents today for evaluation of pain on the right side of her head after she fell off a horse and the horse stepped on the side of her head while it was getting up.  On my exam she has a 5 cm deep laceration behind her right ear.  CT head and neck were obtained showing a small volume acute hemorrhage with a mildly distal depressed skull fracture of the right temporal bone and a small amount of pneumocephalus.  Tdap is updated.  Patient is awake and alert, neurovascularly intact on my exam.  She denies any other injuries.  Neurosurgery is consulted, I spoke with Sabino Dick, PA-C, he requested that I close the wound and he will see patient for admission.  Wound repair was performed, please see procedure note.  Wound repair was performed with gentle irrigation to remove visualized foreign bodies with out pressure that would force   Note: Portions of this report may have been transcribed using voice recognition software. Every effort was made to ensure accuracy; however, inadvertent computerized transcription errors may be present.    Final Clinical Impression(s) / ED Diagnoses Final diagnoses:  Traumatic intracranial hemorrhage without loss of consciousness, initial encounter [facility])  Open fracture of temporal bone, initial encounter (HCC)  Injury while horseback riding  Pneumocephalus, traumatic    Rx / DC Orders ED Discharge Orders    None       Norman Clay 04/23/20 2306    Vanetta Mulders, MD 04/26/20 [phone]

## 2020-04-23 NOTE — H&P (Signed)
Chief Complaint   No chief complaint on file.   HPI   Consult requested by: Mikael Spray, EDP [facility] Reason for consult: Temporal bone fracture, SDH  HPI: Kerri Moss is a 24 y.o. female with no known past medical history who presented to the emergency room after a horse stepped on her head.   She is an avid horseback rider and was out treating a horse today when the horse got scared  Resulting in the horse falling in her falling off of the horse.  She did not sustain any immediate injuries but unfortunately when she went to get up the horse stepped on the right side of her head.  She immediately noticed muffling of sounds but did not lose consciousness. As part of workup she underwent a CT scan of her head which revealed a right temporal bone fracture and subsequent hemorrhage below.   Neurosurgery was called for further recommendations and admission.  She currently feels well now.  She has no headaches, dizziness, changes in vision, numbness tingling or weakness in her extremities.  She is not on any blood thinning medications.   Patient Active Problem List   Diagnosis Date Noted  . Traumatic intracranial hemorrhage without loss of consciousness (HCC) 04/23/2020  . Open fracture of temporal bone (HCC) 04/23/2020  . Pneumocephalus, traumatic 04/23/2020  . Temporal bone fracture (HCC) 04/23/2020    PMH: Past Medical History:  Diagnosis Date  . Car sickness   . Environmental allergies     PSH: Past Surgical History:  Procedure Laterality Date  . DENTAL SURGERY    . TONSILLECTOMY AND ADENOIDECTOMY N/A 08/06/2017   Procedure: TONSILLECTOMY AND POSSIBLE ADENOIDECTOMY;  Surgeon: Linus Salmons, MD;  Location: [facility];  Service: ENT;  Laterality: N/A;    (Not in a hospital admission)   SH: Social History   Tobacco Use  . Smoking status: Never Smoker  . Smokeless tobacco: Never Used  Substance Use Topics  . Alcohol use: Yes    Comment: rare  . Drug use:  Not on file    MEDS: Prior to Admission medications   Medication Sig Start Date End Date Taking? Authorizing Provider  cetirizine (ZYRTEC) 10 MG tablet Take 10 mg by mouth daily as needed for allergies or rhinitis.  12/08/19  Yes [provider]  fluticasone (FLONASE) 50 MCG/ACT nasal spray Place 2 sprays into both nostrils daily for 7 days. Patient not taking: Reported on 04/23/2020 10/24/17 04/23/20  Lucia Estelle, NP    ALLERGY: Allergies  Allergen Reactions  . Other Shortness Of Breath and Other (See Comments)    Seasonal allergies- Neb treatments when younger (shots, too)  Severe environmental allergies    Social History   Tobacco Use  . Smoking status: Never Smoker  . Smokeless tobacco: Never Used  Substance Use Topics  . Alcohol use: Yes    Comment: rare     No family history on file.   ROS   Review of Systems  All other systems reviewed and are negative.   Exam   Vitals:   04/23/20 1624 04/23/20 1730  BP: 121/69 122/76  Pulse: 71 71  Resp: 14 18  Temp:    SpO2: 100% 100%   General appearance: WDWN, NAD GCS 15   Eyes: No scleral injection Cardiovascular: Regular rate and rhythm without murmurs, rubs, gallops. No edema or variciosities. Distal pulses normal. Pulmonary: Effort normal, non-labored breathing Musculoskeletal:     Muscle tone upper extremities: Normal  Muscle tone lower extremities: Normal    Motor exam: Upper Extremities Deltoid Bicep Tricep Grip  Right 5/5 5/5 5/5 5/5  Left 5/5 5/5 5/5 5/5   Lower Extremity IP Quad PF DF EHL  Right 5/5 5/5 5/5 5/5 5/5  Left 5/5 5/5 5/5 5/5 5/5   Neurological Mental Status:    - Patient is awake, alert, oriented to person, place, month, year, and situation    - Patient is able to give a clear and coherent history.    - No signs of aphasia or neglect Cranial Nerves    - II: Visual Fields are full. PERRL    - III/IV/VI: EOMI without ptosis or diploplia.     - V: Facial sensation is grossly  normal    - VII: Facial movement is symmetric.     - VIII: hearing is intact to voice    - X: Uvula elevates symmetrically    - XI: Shoulder shrug is symmetric.    - XII: tongue is midline without atrophy or fasciculations.  Sensory: Sensation grossly intact to LT SKIN: lac right temporal No rhinorrhea or otorrhea noted  Results - Imaging/Labs   Results for orders placed or performed during the hospital encounter of 04/23/20 (from the past 48 hour(s))  Basic metabolic panel     Status: Abnormal   Collection Time: 04/23/20  4:11 PM  Result Value Ref Range   Sodium 137 135 - 145 mmol/L   Potassium 3.4 (L) 3.5 - 5.1 mmol/L   Chloride 105 98 - 111 mmol/L   CO2 23 22 - 32 mmol/L   Glucose, Bld 117 (H) 70 - 99 mg/dL    Comment: Glucose reference range applies only to samples taken after fasting for at least 8 hours.   BUN 9 6 - 20 mg/dL   Creatinine, Ser 1.610.93 0.44 - 1.00 mg/dL   Calcium 9.0 8.9 - 09.610.3 mg/dL   GFR calc non Af Amer >60 >60 mL/min   GFR calc Af Amer >60 >60 mL/min   Anion gap 9 5 - 15    Comment: Performed at [facility], [address] [place], [place] [address]  CBC with Differential     Status: Abnormal   Collection Time: 04/23/20  4:11 PM  Result Value Ref Range   WBC 16.5 (H) 4.0 - 10.5 K/uL   RBC 4.44 3.87 - 5.11 MIL/uL   Hemoglobin 12.6 12.0 - 15.0 g/dL   HCT 98.139.9 36 - 46 %   MCV 89.9 80.0 - 100.0 fL   MCH 28.4 26.0 - 34.0 pg   MCHC 31.6 30.0 - 36.0 g/dL   RDW 19.114.0 47.811.5 - 29.515.5 %   Platelets 217 150 - 400 K/uL   nRBC 0.0 0.0 - 0.2 %   Neutrophils Relative % 75 %   Neutro Abs 12.5 (H) 1.7 - 7.7 K/uL   Lymphocytes Relative 18 %   Lymphs Abs 2.9 0.7 - 4.0 K/uL   Monocytes Relative 5 %   Monocytes Absolute 0.8 0 - 1 K/uL   Eosinophils Relative 1 %   Eosinophils Absolute 0.2 0 - 0 K/uL   Basophils Relative 1 %   Basophils Absolute 0.1 0 - 0 K/uL   Immature Granulocytes 0 %   Abs Immature Granulocytes 0.07 0.00 - 0.07 K/uL    Comment: Performed at  [facility], [address] [place], [place] [address]  SARS Coronavirus 2 by RT PCR (hospital order, performed in [facility]  hospital lab) Nasopharyngeal Nasopharyngeal Swab     Status: None   Collection Time: 04/23/20  4:23 PM   Specimen: Nasopharyngeal Swab  Result Value Ref Range   SARS Coronavirus 2 NEGATIVE NEGATIVE    Comment: (NOTE) SARS-CoV-2 target nucleic acids are NOT DETECTED.  The SARS-CoV-2 RNA is generally detectable in upper and lower respiratory specimens during the acute phase of infection. The lowest concentration of SARS-CoV-2 viral copies this assay can detect is 250 copies / mL. A negative result does not preclude SARS-CoV-2 infection and should not be used as the sole basis for treatment or other patient management decisions.  A negative result may occur with improper specimen collection / handling, submission of specimen other than nasopharyngeal swab, presence of viral mutation(s) within the areas targeted by this assay, and inadequate number of viral copies (<250 copies / mL). A negative result must be combined with clinical observations, patient history, and epidemiological information.  Fact Sheet for Patients:   BoilerBrush.com.cy  Fact Sheet for Healthcare Providers: https://pope.com/  This test is not yet approved or  cleared by the [place] FDA and has been authorized for detection and/or diagnosis of SARS-CoV-2 by FDA under an Emergency Use Authorization (EUA).  This EUA will remain in effect (meaning this test can be used) for the duration of the COVID-19 declaration under Section 564(b)(1) of the Act, 21 U.S.C. section 360bbb-3(b)(1), unless the authorization is terminated or revoked sooner.  Performed at [facility], [address] [place], [place] [address]   I-Stat beta hCG blood, ED     Status: None   Collection Time: 04/23/20  4:29 PM  Result Value Ref Range   I-stat  hCG, quantitative <5.0 <5 mIU/mL   Comment 3            Comment:   GEST. AGE      CONC.  (mIU/mL)   <=1 WEEK        5 - 50     2 WEEKS       50 - 500     3 WEEKS       100 - 10,000     4 WEEKS     1,000 - 30,000        FEMALE AND NON-PREGNANT FEMALE:     LESS THAN 5 mIU/mL     CT Head Wo Contrast  Result Date: 04/23/2020 CLINICAL DATA:  Head trauma, focal neuro findings. Poly trauma, critical, head/cervical spine injury suspected. Additional history provided: Fall off horse. EXAM: CT HEAD WITHOUT CONTRAST CT CERVICAL SPINE WITHOUT CONTRAST TECHNIQUE: Multidetector CT imaging of the head and cervical spine was performed following the standard protocol without intravenous contrast. Multiplanar CT image reconstructions of the cervical spine were also generated. COMPARISON:  No pertinent prior exams are available for comparison. FINDINGS: CT HEAD FINDINGS Brain: There is thin acute extra-axial hematoma and a small amount of extra-axial pneumocephalus overlying the lateral right temporoparietal lobes. The hemorrhage may be subdural or epidural and measures up to 3 mm in greatest thickness (for instance as seen on series 1, image 15). There is an overlying mildly depressed fracture of the squamosal right temporal bone (for instance as seen on series 5, image 38). Cerebral volume is normal. No demarcated cortical infarct. No evidence of intracranial mass. No midline shift. Vascular: No hyperdense vessel. Skull: Mildly depressed acute fracture of the squamosal right temporal bone. Sinuses/Orbits: Visualized orbits show no acute finding. No significant paranasal sinus disease or mastoid effusion.  Other: Subcutaneous gas and soft tissue swelling on the right, overlying the right temporal bone fracture. CT CERVICAL SPINE FINDINGS Alignment: Mild nonspecific reversal of the expected cervical lordosis. Skull base and vertebrae: The basion-dental and atlanto-dental intervals are maintained.No evidence of acute  fracture to the cervical spine. Soft tissues and spinal canal: No prevertebral fluid or swelling. No visible canal hematoma. Disc levels: No significant bony spinal canal or neural foraminal narrowing at any level. Upper chest: No consolidation within the imaged lung apices. No visible pneumothorax. These results were called by telephone at the time of interpretation on 04/23/2020 at 3:54 pm to provider Dr. Clarice Pole, who verbally acknowledged these results. IMPRESSION: CT head: There is small-volume acute extra-axial hemorrhage and small amount of pneumocephalus overlying the right temporoparietal lobes. The hemorrhage measures up to 3 mm in thickness and may be subdural or epidural in location. Overlying mildly depressed acute fracture of the squamosal right temporal bone. Overlying scalp soft tissue swelling and subcutaneous gas. CT cervical spine: 1. No evidence of acute fracture to the cervical spine. 2. Nonspecific reversal of the expected cervical lordosis. Electronically Signed   By: Jackey Loge DO   On: 04/23/2020 15:55   CT Cervical Spine Wo Contrast  Result Date: 04/23/2020 CLINICAL DATA:  Head trauma, focal neuro findings. Poly trauma, critical, head/cervical spine injury suspected. Additional history provided: Fall off horse. EXAM: CT HEAD WITHOUT CONTRAST CT CERVICAL SPINE WITHOUT CONTRAST TECHNIQUE: Multidetector CT imaging of the head and cervical spine was performed following the standard protocol without intravenous contrast. Multiplanar CT image reconstructions of the cervical spine were also generated. COMPARISON:  No pertinent prior exams are available for comparison. FINDINGS: CT HEAD FINDINGS Brain: There is thin acute extra-axial hematoma and a small amount of extra-axial pneumocephalus overlying the lateral right temporoparietal lobes. The hemorrhage may be subdural or epidural and measures up to 3 mm in greatest thickness (for instance as seen on series 1, image 15). There is an overlying  mildly depressed fracture of the squamosal right temporal bone (for instance as seen on series 5, image 38). Cerebral volume is normal. No demarcated cortical infarct. No evidence of intracranial mass. No midline shift. Vascular: No hyperdense vessel. Skull: Mildly depressed acute fracture of the squamosal right temporal bone. Sinuses/Orbits: Visualized orbits show no acute finding. No significant paranasal sinus disease or mastoid effusion. Other: Subcutaneous gas and soft tissue swelling on the right, overlying the right temporal bone fracture. CT CERVICAL SPINE FINDINGS Alignment: Mild nonspecific reversal of the expected cervical lordosis. Skull base and vertebrae: The basion-dental and atlanto-dental intervals are maintained.No evidence of acute fracture to the cervical spine. Soft tissues and spinal canal: No prevertebral fluid or swelling. No visible canal hematoma. Disc levels: No significant bony spinal canal or neural foraminal narrowing at any level. Upper chest: No consolidation within the imaged lung apices. No visible pneumothorax. These results were called by telephone at the time of interpretation on 04/23/2020 at 3:54 pm to provider Dr. Clarice Pole, who verbally acknowledged these results. IMPRESSION: CT head: There is small-volume acute extra-axial hemorrhage and small amount of pneumocephalus overlying the right temporoparietal lobes. The hemorrhage measures up to 3 mm in thickness and may be subdural or epidural in location. Overlying mildly depressed acute fracture of the squamosal right temporal bone. Overlying scalp soft tissue swelling and subcutaneous gas. CT cervical spine: 1. No evidence of acute fracture to the cervical spine. 2. Nonspecific reversal of the expected cervical lordosis. Electronically Signed   By: Ronaldo Miyamoto  Golden DO   On: 04/23/2020 15:55   Impression/Plan   25 y.o. female with open right temporal bone fracture and associated pneumocephalus and extra-axial hemorrhage after  being stepped on by a horse.  She is neurologically intact and essentially asymptomatic.  Will admit to the neuro ICU for observation overnight.  Open right temporal bone fracture - minimal depression. No evidence of CSF leak on exam. No indication for NS intervention. - okay to close wound at bedside (discussed with EDP).  - place on prophylactic keflex   Extra-axial hemorrhage - Secondary to temporal fracture. ?SDH vs. EDH.  - No indication for neurosurgical intervention - q 1 hour neuro checks - repeat head CT in 6 hours, sooner as indicated by exam - Keppra 500mg  BID x7days for routine seizure prophylaxis  , PA-C [facility]

## 2020-04-23 NOTE — ED Notes (Signed)
Pt to CT at this time.

## 2020-04-23 NOTE — ED Triage Notes (Signed)
Patient complains of head and jaw pain after being stepped on by horse. Small laceration to head with minimal bleeding.

## 2020-04-24 DIAGNOSIS — S06360A Traumatic hemorrhage of cerebrum, unspecified, without loss of consciousness, initial encounter: Secondary | ICD-10-CM | POA: Diagnosis not present

## 2020-04-24 DIAGNOSIS — S0219XB Other fracture of base of skull, initial encounter for open fracture: Secondary | ICD-10-CM | POA: Diagnosis not present

## 2020-04-24 LAB — MRSA PCR SCREENING: MRSA by PCR: NEGATIVE

## 2020-04-24 MED ORDER — CHLORHEXIDINE GLUCONATE CLOTH 2 % EX PADS: 6.0000 | MEDICATED_PAD | Freq: Every day | CUTANEOUS | Status: DC

## 2020-04-24 MED ORDER — LEVETIRACETAM 500 MG PO TABS: 500.0000 mg | ORAL_TABLET | Freq: Two times a day (BID) | ORAL | 0 refills | Status: DC

## 2020-04-24 MED ORDER — CEPHALEXIN 500 MG PO CAPS: 500.0000 mg | ORAL_CAPSULE | Freq: Three times a day (TID) | ORAL | 0 refills | Status: AC

## 2020-04-24 NOTE — Discharge Summary (Signed)
Physician Discharge Summary  Patient ID: [name] MRN: [id number] DOB/AGE: 06-22-1996 24 y.o.  Admit date: 04/23/2020 Discharge date: 04/24/2020  Admission Diagnoses:  Traumatic ICH Open temporal bone fracture  Discharge Diagnoses:  Same Active Problems:   Traumatic intracranial hemorrhage without loss of consciousness (HCC)   Open fracture of temporal bone (HCC)   Pneumocephalus, traumatic   Temporal bone fracture [facility])   Discharged Condition: Stable  Hospital Course:  Kerri Moss is a 24 y.o. female who presented to ED yesterday after falling off a horse and subsequently being stepped on the head. She underwent work up by EDP and was found to have an open mildly depressed temporal fracture in addition to underlying hemorrhage. Hemorrhage minimal and did not require surgery. She was neuro intact and admitted under NS service for observation. Repeat imaging showed stable hemorrhage. Patient remained neuro intact. She was discharged on 7/28 in hemodynamically stable condition.  She wil need to complete 10 day course of keflex for infection prophylaxis given open skull fracture. She will also need to complete 7 day course of keppra for routine seizure prophylaxis.  Treatments: Surgery - none  Discharge Exam: Blood pressure 113/74, pulse 60, temperature 98.3 F (36.8 C), temperature source Oral, resp. rate 19, height 5\' 7"  (1.702 m), weight 77.1 kg, SpO2 100 %. Awake, alert, oriented Speech fluent, appropriate CN grossly intact 5/5 BUE/BLE Wound c/d/i  Disposition: Discharge disposition: 01-Home or Self Care       Discharge Instructions    Call MD for:  difficulty breathing, headache or visual disturbances   Complete by: As directed    Call MD for:  persistant dizziness or light-headedness   Complete by: As directed    Call MD for:  redness, tenderness, or signs of infection (pain, swelling, redness, odor or green/yellow discharge around incision site)   Complete  by: As directed    Call MD for:  severe uncontrolled pain   Complete by: As directed    Call MD for:  temperature >100.4   Complete by: As directed    Diet general   Complete by: As directed    Driving Restrictions   Complete by: As directed    Do not drive until given clearance.   Increase activity slowly   Complete by: As directed    Lifting restrictions   Complete by: As directed    Do not lift anything >10lbs. Avoid bending and twisting in awkward positions. Avoid bending at the back.   May shower / Bathe   Complete by: As directed    In 24 hours. Okay to wash wound with warm soapy water. Avoid scrubbing the wound. Pat dry.   Remove dressing in 24 hours   Complete by: As directed      Allergies as of 04/24/2020      Reactions   Other Shortness Of Breath, Other (See Comments)   Seasonal allergies- Neb treatments when younger (shots, too) Severe environmental allergies      Medication List    TAKE these medications   cephALEXin 500 MG capsule Commonly known as: KEFLEX Take 1 capsule (500 mg total) by mouth every 8 (eight) hours for 9 days.   cetirizine 10 MG tablet Commonly known as: ZYRTEC Take 10 mg by mouth daily as needed for allergies or rhinitis.   fluticasone 50 MCG/ACT nasal spray Commonly known as: Flonase Place 2 sprays into both nostrils daily for 7 days.   levETIRAcetam 500 MG tablet Commonly known as: KEPPRA Take  1 tablet (500 mg total) by mouth 2 (two) times daily.       Follow-up Information    Lisbeth Renshaw, MD. Schedule an appointment as soon as possible for a visit in 2 week(s).   Specialty: Neurosurgery Why: staple removal Contact information: [address] [place] [address] [phone]               Signed: Alyson Ingles 04/24/2020, 8:16 AM

## 2020-04-24 NOTE — Evaluation (Signed)
Occupational Therapy Evaluation Patient Details Name: Kerri Moss MRN: [id number] DOB: [birth year] Today's Date: 04/24/2020    History of Present Illness 89 female admitted after being stepped on by horse. Pt with nondisplaced fx of L temporal bone and very samll underlying SDH. PMH none to report   Clinical Impression   Patient evaluated by Occupational Therapy with no further acute OT needs identified. All education has been completed and the patient has no further questions. See below for any follow-up Occupational Therapy or equipment needs. OT to sign off. Thank you for referral.      Follow Up Recommendations  No OT follow up    Equipment Recommendations  None recommended by OT    Recommendations for Other Services       Precautions / Restrictions Precautions Precautions: Fall      Mobility Bed Mobility Overal bed mobility: Independent                Transfers Overall transfer level: Independent                    Balance                                 Standardized Balance Assessment Standardized Balance Assessment : Berg Balance Test;Dynamic Gait Index Berg Balance Test Sit to Stand: Able to stand without using hands and stabilize independently Standing Unsupported: Able to stand safely 2 minutes Sitting with Back Unsupported but Feet Supported on Floor or Stool: Able to sit safely and securely 2 minutes Stand to Sit: Sits safely with minimal use of hands Transfers: Able to transfer safely, minor use of hands Turn 360 Degrees: Able to turn 360 degrees safely in 4 seconds or less Dynamic Gait Index Level Surface: Normal     ADL either performed or assessed with clinical judgement   ADL Overall ADL's : Independent                                       General ADL Comments: pt noted to have compensatory strategies keep spine very straight and reaching without visually tilting head. OT cued patient to this and  when atempting reports it is causing pain. Pt educated on visual gaze stabilization and post concussion symptoms. pt declines handout offered. Mother present but patient asked to step out of the room for part of session     Vision Baseline Vision/History: No visual deficits       Perception     Praxis      Pertinent Vitals/Pain Pain Assessment: Faces Faces Pain Scale: Hurts little more Pain Location: right ear area near laceration Pain Descriptors / Indicators: Grimacing Pain Intervention(s): Monitored during session;Premedicated before session;Repositioned     Hand Dominance Right   Extremity/Trunk Assessment Upper Extremity Assessment Upper Extremity Assessment: Overall WFL for tasks assessed   Lower Extremity Assessment Lower Extremity Assessment: Overall WFL for tasks assessed   Cervical / Trunk Assessment Cervical / Trunk Assessment: Normal (reports stiffness but able to complete ROM)   Communication Communication Communication: No difficulties   Cognition Arousal/Alertness: Awake/alert Behavior During Therapy: WFL for tasks assessed/performed Overall Cognitive Status: Within Functional Limits for tasks assessed  General Comments  pt self cleaning wound. educated on (A) first shower to decrease fall risk. Pt educated on hair management with wound.     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        OT Problem List:        OT Treatment/Interventions:      OT Goals(Current goals can be found in the care plan section) Acute Rehab OT Goals Patient Stated Goal: to go home  OT Goal Formulation: With patient  OT Frequency:     Barriers to D/C:            Co-evaluation              AM-PAC OT "6 Clicks" Daily Activity     Outcome Measure Help from another person eating meals?: None Help from another person taking care of personal grooming?: None Help from another person toileting, which includes using toliet, bedpan, or urinal?: None Help from another person bathing (including washing, rinsing, drying)?: None Help from another person to put on and taking off regular upper body clothing?: None Help from another person to put on and taking off regular lower body clothing?: None 6 Click Score: 24   End of Session Nurse Communication: Mobility status;Precautions  Activity Tolerance: Patient tolerated treatment well Patient left: in bed;with call bell/phone within reach;with family/visitor present;with nursing/sitter in room  OT Visit Diagnosis: Unsteadiness on feet (R26.81)                Time: 1610-9604 OT Time Calculation (min): 13 min Charges:  OT General Charges $OT Visit: 1 Visit OT Evaluation $OT Eval Low Complexity: 1 Low   Brynn, OTR/L  Acute Rehabilitation Services Pager: [phone] Office: [phone] .   Mateo Flow 04/24/2020, 10:10 AM

## 2020-04-24 NOTE — Progress Notes (Signed)
  NEUROSURGERY PROGRESS NOTE   No issues overnight No concerns this am  EXAM:  BP 113/74   Pulse 60   Temp 98.3 F (36.8 C) (Oral)   Resp 19   Ht 5\' 7"  (1.702 m)   Wt 77.1 kg   SpO2 100%   BMI 26.63 kg/m   Awake, alert, oriented  Speech fluent, appropriate  CN grossly intact  5/5 BUE/BLE   IMPRESSION/PLAN 24 y.o. female s/p fall off horse with open temporal fracture and underlying extra-axial hemorrhage that is stable on repeat imaging. She is neurologically intact and essentially asx.  - D/C home

## 2020-04-24 NOTE — ED Notes (Signed)
Pt resting comfortably with eyes closed at this time, no acute distress noted. Call bell in reach, mother at bedside.

## 2020-04-24 NOTE — Discharge Instructions (Signed)
[facility] (pp. 2876-2897). [place], [place]. Elsevier."> Neurosurgery, 80(1), 6-15. Retrieved on March 19, 2019.[email or web address]"> Primary Care (5th ed., pp. 218-221). [place], [place]: Elsevier."> Brain Injury, 29(6), 688-700. [email or web address]"> Rosen's Emergency Medicine: Concepts and Clinical Practice (9th ed., pp. 301-329). [place], [place]: Elsevier.">  Head Injury, Adult There are many types of head injuries. Head injuries can be as minor as a bump, or they can be a serious medical issue. More severe head injuries include:  A jarring injury to the brain (concussion).  A bruise (contusion) of the brain. This means there is bleeding in the brain that can cause swelling.  A cracked skull (skull fracture).  Bleeding in the brain that collects, clots, and forms a bump (hematoma). After a head injury, most problems occur within the first 24 hours, but side effects may occur up to 7-10 days after the injury. It is important to watch your condition for any changes. You may need to be observed in the emergency department or urgent care, or you may be admitted to the hospital. What are the causes? There are many possible causes of a head injury. A serious head injury may be caused by a car accident, bicycle or motorcycle accidents, sports injuries, and falls. What are the symptoms? Symptoms of a head injury include a contusion, bump, or bleeding at the site of the injury. Other physical symptoms may include:  Headache.  Nausea or vomiting.  Dizziness.  Feeling tired.  Being uncomfortable around bright lights or loud noises.  Seizures.  Trouble being awakened.  Fainting. Mental or emotional symptoms may include:  Irritability.  Confusion and memory problems.  Poor attention and concentration.  Changes in eating or sleeping habits.  Anxiety or depression. How is this diagnosed? This condition can  usually be diagnosed based on your symptoms, a description of the injury, and a physical exam. You may also have imaging tests done, such as a CT scan or MRI. How is this treated? Treatment for this condition depends on the severity and type of injury you have. The main goal of treatment is to prevent complications and to allow the brain time to heal. Mild head injury If you have a mild head injury, you may be sent home and treatment may include:  Observation. A responsible adult should stay with you for 24 hours after your injury and check on you often.  Physical rest.  Brain rest.  Pain medicines. Severe head injury If you have a severe head injury, treatment may include:  Close observation. This includes hospitalization with frequent physical exams.  Medicines to relieve pain, prevent seizures, and decrease brain swelling.  Breathing support. This may include using a ventilator.  Treatments to manage the swelling inside the brain.  Brain surgery. This may be needed to: ? Remove a blood clot. ? Stop the bleeding. ? Remove a part of the skull to allow room for the brain to swell. Follow these instructions at home: Activity  Rest and avoid activities that are physically hard or tiring.  Make sure you get enough sleep.  Limit activities that require a lot of thought or attention, such as: ? Watching TV. ? Playing memory games and puzzles. ? Job-related work or homework. ? Working on the computer, using social media, and texting.  Avoid activities that could cause another head injury, such as playing sports, until your health care provider approves. Having another head injury, especially before the first one has healed, can be dangerous.  Ask your health care   provider when it is safe for you to return to your regular activities, including work or school. Ask your health care provider for a step-by-step plan for gradually returning to activities.  Ask your health care  provider when you can drive, ride a bicycle, or use heavy machinery. Your ability to react may be slower after a brain injury. Do not do these activities if you are dizzy. Lifestyle   Do not drink alcohol until your health care provider approves. Do not use drugs. Alcohol and certain drugs may slow your recovery and can put you at risk of further injury.  If it is harder than usual to remember things, write them down.  If you are easily distracted, try to do one thing at a time.  Talk with family members or close friends when making important decisions.  Tell your friends, family, a trusted colleague, and work manager about your injury, symptoms, and restrictions. Have them watch for any new or worsening problems. General instructions  Take over-the-counter and prescription medicines only as told by your health care provider.  Have someone stay with you for 24 hours after your head injury. This person should watch you for any changes in your symptoms and be ready to seek medical help.  Keep all follow-up visits as told by your health care provider. This is important. How is this prevented?  Work on improving your balance and strength to avoid falls.  Wear a seatbelt when you are in a moving vehicle.  Wear a helmet when riding a bicycle, skiing, or doing any other sport or activity that has a risk of injury.  If you drink alcohol: ? Limit how much you use to:  0-1 drink a day for women.  0-2 drinks a day for men. ? Be aware of how much alcohol is in your drink. In the [place] one drink equals one 12 oz bottle of beer (355 mL), one 5 oz glass of wine (148 mL), or one 1 oz glass of hard liquor (44 mL).  Take safety measures in your home, such as: ? Removing clutter and tripping hazards from floors and stairways. ? Using grab bars in bathrooms and handrails by stairs. ? Placing non-slip mats on floors and in bathtubs. ? Improving lighting in dim areas. Get help right away  if:  You have: ? A severe headache that is not helped by medicine. ? Trouble walking or weakness in your arms and legs. ? Clear or bloody fluid coming from your nose or ears. ? Changes in your vision. ? A seizure.  You lose your balance.  You vomit.  Your pupils change size.  Your speech is slurred.  Your dizziness gets worse.  You faint.  You are sleepier than normal and have trouble staying awake.  Your symptoms get worse. These symptoms may represent a serious problem that is an emergency. Do not wait to see if the symptoms will go away. Get medical help right away. Call your local emergency services (911 in the [place] Do not drive yourself to the hospital. Summary  Head injuries can be minor or they can be a serious medical issue requiring immediate attention.  Treatment for this condition depends on the severity and type of injury you have.  Ask your health care provider when it is safe for you to return to your regular activities, including work or school.  Head injury prevention includes wearing a seat belt in a motor vehicle, using a helmet on a bicycle, limiting alcohol   use, and taking safety measures in your home. This information is not intended to replace advice given to you by your health care provider. Make sure you discuss any questions you have with your health care provider. Document Revised: 10/12/2018 Document Reviewed: 10/07/2018 Elsevier Patient Education  2020 [employer]  

## 2020-04-24 NOTE — ED Notes (Signed)
Pt up to pt bathroom with steady gait, mother up with pt to provide assistance.

## 2020-04-24 NOTE — ED Notes (Signed)
Pt ambulated to restroom with steady gait. Stand by assist for safety.

## 2020-05-13 DIAGNOSIS — Z6827 Body mass index (BMI) 27.0-27.9, adult: Secondary | ICD-10-CM | POA: Diagnosis not present

## 2020-05-13 DIAGNOSIS — S065X9A Traumatic subdural hemorrhage with loss of consciousness of unspecified duration, initial encounter: Secondary | ICD-10-CM | POA: Diagnosis not present

## 2020-06-21 ENCOUNTER — Ambulatory Visit (INDEPENDENT_AMBULATORY_CARE_PROVIDER_SITE_OTHER): Payer: Federal, State, Local not specified - PPO

## 2020-06-21 ENCOUNTER — Encounter: Payer: Self-pay | Admitting: Family Medicine

## 2020-06-21 ENCOUNTER — Other Ambulatory Visit: Payer: Self-pay

## 2020-06-21 ENCOUNTER — Ambulatory Visit: Payer: Federal, State, Local not specified - PPO | Admitting: Family Medicine

## 2020-06-21 VITALS — BP 110/78 | HR 63 | Ht 67.0 in | Wt 170.0 lb

## 2020-06-21 DIAGNOSIS — M545 Low back pain, unspecified: Secondary | ICD-10-CM

## 2020-06-21 DIAGNOSIS — M533 Sacrococcygeal disorders, not elsewhere classified: Secondary | ICD-10-CM | POA: Diagnosis not present

## 2020-06-21 MED ORDER — TIZANIDINE HCL 4 MG PO TABS
4.0000 mg | ORAL_TABLET | Freq: Three times a day (TID) | ORAL | 1 refills | Status: DC | PRN
Start: 2020-06-21 — End: 2021-07-02

## 2020-06-21 NOTE — Patient Instructions (Signed)
Thank you for coming in today.  Plan for PT.  Use heating pad and TENS unit.   Recheck with me in 6 weeks especially if not better.   If you are looking for primary care doctor who is also horse person Dr Sunnie Nielsen [facility] Kathryne Sharper.   TENS UNIT: This is helpful for muscle pain and spasm.   Search and Purchase a TENS 7000 2nd edition at  www.tenspros.com or www.Amazon.com It should be less than $30.     TENS unit instructions: Do not shower or bathe with the unit on . Turn the unit off before removing electrodes or batteries . If the electrodes lose stickiness add a drop of water to the electrodes after they are disconnected from the unit and place on plastic sheet. If you continued to have difficulty, call the TENS unit company to purchase more electrodes. . Do not apply lotion on the skin area prior to use. Make sure the skin is clean and dry as this will help prolong the life of the electrodes. . After use, always check skin for unusual red areas, rash or other skin difficulties. If there are any skin problems, does not apply electrodes to the same area. . Never remove the electrodes from the unit by pulling the wires. . Do not use the TENS unit or electrodes other than as directed. . Do not change electrode placement without consultating your therapist or physician. Marland Kitchen Keep 2 fingers with between each electrode. . Wear time ratio is 2:1, on to off times.    For example on for 30 minutes off for 15 minutes and then on for 30 minutes off for 15 minutes     Lumbosacral Strain Lumbosacral strain is an injury that causes pain in the lower back (lumbosacral spine). This injury usually happens from overstretching the muscles or ligaments along your spine. Ligaments are cord-like tissues that connect bones to other bones. A strain can affect one or more muscles or ligaments. What are the causes? This condition may be caused by: A hard, direct hit to the  back. Overstretching the lower back muscles. This may result from: A fall. Lifting something heavy. Repetitive movements such as bending or crouching. What increases the risk? The following factors may make you more likely to develop this condition: Participating in sports or activities that involve: A sudden twist of the back. Pushing or pulling motions. Being overweight or obese. Having poor strength and flexibility, especially tight hamstrings or weak muscles in the back or abdomen. Having too much of a curve in the lower back. Having a pelvis that is tilted forward. What are the signs or symptoms? The main symptom of this condition is pain in the lower back, at the site of the strain. Pain may also be felt down one or both legs. How is this diagnosed? This condition is diagnosed based on your symptoms, your medical history, and a physical exam. During the physical exam, your health care provider may push on certain areas of your back to find the source of your pain. You may be asked to bend forward, backward, and side to side to check your pain and range of motion. You may also have imaging tests, such as X-rays and an MRI. How is this treated? This condition may be treated by: Applying heat and cold on the affected area. Taking medicines to help relieve pain and relax your muscles. Taking NSAIDs, such as ibuprofen, to help reduce swelling and discomfort. Doing stretching and strengthening  exercises for your lower back. Symptoms usually improve within several weeks of treatment. However, recovery time varies. When your symptoms improve, gradually return to your normal routine as soon as possible to reduce pain, avoid stiffness, and keep muscle strength. Follow these instructions at home: Medicines Take over-the-counter and prescription medicines only as told by your health care provider. Ask your health care provider if the medicine prescribed to you: Requires you to avoid driving  or using heavy machinery. Can cause constipation. You may need to take these actions to prevent or treat constipation: Drink enough fluid to keep your urine pale yellow. Take over-the-counter or prescription medicines. Eat foods that are high in fiber, such as beans, whole grains, and fresh fruits and vegetables. Limit foods that are high in fat and processed sugars, such as fried or sweet foods. Managing pain, stiffness, and swelling     If directed, put ice on the injured area. To do this: Put ice in a plastic bag. Place a towel between your skin and the bag. Leave the ice on for 20 minutes, 2-3 times a day. If directed, apply heat on the affected area as often as told by your health care provider. Use the heat source that your health care provider recommends, such as a moist heat pack or a heating pad. Place a towel between your skin and the heat source. Leave the heat on for 20-30 minutes. Remove the heat if your skin turns bright red. This is especially important if you are unable to feel pain, heat, or cold. You may have a greater risk of getting burned. Activity Rest as told by your health care provider. Do not stay in bed. Staying in bed for more than 1-2 days can delay your recovery. Return to your normal activities as told by your health care provider. Ask your health care provider what activities are safe for you. Avoid activities that take a lot of energy for as long as told by your health care provider. Do exercises as told by your health care provider. This includes stretching and strengthening exercises. General instructions Sit up and stand up straight. Avoid leaning forward when you sit, or hunching over when you stand. Do not use any products that contain nicotine or tobacco, such as cigarettes, e-cigarettes, and chewing tobacco. If you need help quitting, ask your health care provider. Keep all follow-up visits as told by your health care provider. This is  important. How is this prevented?  Use correct form when playing sports and lifting heavy objects. Use good posture when sitting and standing. Maintain a healthy weight. Sleep on a mattress with medium firmness to support your back. Do at least 150 minutes of moderate-intensity exercise each week, such as brisk walking or water aerobics. Try a form of exercise that takes stress off your back, such as swimming or stationary cycling. Maintain physical fitness, including: Strength. Flexibility. Contact a health care provider if: Your back pain does not improve after several weeks of treatment. Your symptoms get worse. Get help right away if: Your back pain is severe. You cannot stand or walk. You have difficulty controlling when you urinate or when you have a bowel movement. You feel nauseous or you vomit. Your feet or legs get very cold, turn pale, or look blue. You have numbness, tingling, weakness, or problems using your arms or legs. You develop any of the following: Shortness of breath. Dizziness. Pain in your legs. Weakness in your buttocks or legs. Summary Lumbosacral strain is an  injury that causes pain in the lower back (lumbosacral spine). This injury usually happens from overstretching the muscles or ligaments along your spine. This condition may be caused by a direct hit to the lower back or by overstretching the lower back muscles. Symptoms usually improve within several weeks of treatment. This information is not intended to replace advice given to you by your health care provider. Make sure you discuss any questions you have with your health care provider. Document Revised: 02/07/2019 Document Reviewed: 02/07/2019 Elsevier Patient Education  2020 [employer].

## 2020-06-21 NOTE — Progress Notes (Signed)
Subjective:    CC: Low back pain and Sacral pain  I, Molly Weber, LAT, ATC, am serving as scribe for Dr. Clementeen Graham.  HPI: Pt is a 24 y/o female presenting w/ c/o LBP and sacral pain after falling off of her horse on 06/11/20 and landing on her buttocks and back.  She notes pain in her bilateral low back.  She does not have pain radiating down her legs weakness or numbness distally.  She is tried some medicines over-the-counter which do help.  She is an avid horse rider and shows horses.  She has an event coming up in [place] in about a month.    Pertinent review of Systems: No fevers or chills  Relevant historical information: History temporal bone fracture with small intracranial hemorrhage about 2 months ago after a horse stepped on her head.   Objective:    Vitals:   06/21/20 1043  BP: 110/78  Pulse: 63  SpO2: 98%   General: Well Developed, well nourished, and in no acute distress.   MSK: L-spine normal-appearing nontender midline.  Tender palpation lumbar paraspinal musculature. Lumbar paraspinal musculature is rigid. Decreased lumbar motion.  Lower extremity strength reflexes and sensation are intact distally. Negative slump test bilaterally.  Lab and Radiology Results X-ray images L-spine and sacrum coccyx obtained today personally and independently reviewed  No fractures or malalignment.  Lumbar vertebrae loss of lordosis indicating spasm.  Mild DDD L5-S1.  Await formal radiology review   Impression and Recommendations:    Assessment and Plan: 24 y.o. female with lumbosacral strain and spasm after a fall occurring about 2 weeks ago.  Fortunately seems to be doing reasonably well.  Plan for physical therapy home exercise program heating pad and TENS unit.  Also will prescribe tizanidine.  Discussed horse riding safety while injured.\  Recheck in 6 weeks or so if needed.  Return sooner if needed.  PDMP not reviewed this encounter. Orders Placed This  Encounter  Procedures  . DG Lumbar Spine 2-3 Views    Standing Status:   Future    Number of Occurrences:   1    Standing Expiration Date:   07/21/2020    Order Specific Question:   Reason for Exam (SYMPTOM  OR DIAGNOSIS REQUIRED)    Answer:   Low back pain    Order Specific Question:   Is patient pregnant?    Answer:   No    Order Specific Question:   Preferred imaging location?    Answer:   [facility]  . DG Sacrum/Coccyx    Standing Status:   Future    Number of Occurrences:   1    Standing Expiration Date:   07/21/2020    Order Specific Question:   Reason for Exam (SYMPTOM  OR DIAGNOSIS REQUIRED)    Answer:   Low back pain    Order Specific Question:   Is patient pregnant?    Answer:   No    Order Specific Question:   Preferred imaging location?    Answer:   [facility]  . Ambulatory referral to Physical Therapy    Referral Priority:   Routine    Referral Type:   Physical Medicine    Referral Reason:   Specialty Services Required    Requested Specialty:   Physical Therapy   Meds ordered this encounter  Medications  . tiZANidine (ZANAFLEX) 4 MG tablet    Sig: Take 1 tablet (4 mg total) by mouth every  8 (eight) hours as needed for muscle spasms.    Dispense:  30 tablet    Refill:  1    Discussed warning signs or symptoms. Please see discharge instructions. Patient expresses understanding.   The above documentation has been reviewed and is accurate and complete Clementeen Graham, M.D.

## 2020-06-24 NOTE — Progress Notes (Signed)
X-ray lumbar spine looks normal to radiology with no fractures.

## 2020-06-24 NOTE — Progress Notes (Signed)
X-ray sacrum coccyx shows no fractures.

## 2020-07-08 ENCOUNTER — Ambulatory Visit: Payer: Federal, State, Local not specified - PPO | Admitting: Physical Therapy

## 2020-07-11 ENCOUNTER — Other Ambulatory Visit: Payer: Self-pay

## 2020-07-11 ENCOUNTER — Ambulatory Visit: Payer: Federal, State, Local not specified - PPO | Attending: Family Medicine | Admitting: Physical Therapy

## 2020-07-11 ENCOUNTER — Encounter: Payer: Self-pay | Admitting: Physical Therapy

## 2020-07-11 DIAGNOSIS — R2689 Other abnormalities of gait and mobility: Secondary | ICD-10-CM | POA: Diagnosis not present

## 2020-07-11 DIAGNOSIS — Z7409 Other reduced mobility: Secondary | ICD-10-CM | POA: Diagnosis not present

## 2020-07-11 DIAGNOSIS — M545 Low back pain, unspecified: Secondary | ICD-10-CM

## 2020-07-11 NOTE — Therapy (Signed)
[facility] [address] [place], [place], [address] Phone: [phone]   Fax:  [phone]  Physical Therapy Evaluation  Patient Details  Name: Kerri Moss MRN: [id number] Date of Birth: 11-16-1995 Referring Provider (PT): Earma Reading, MD   Encounter Date: 07/11/2020   PT End of Session - 07/11/20 1220    Visit Number 1    Number of Visits 9    Date for PT Re-Evaluation 09/05/20    Authorization - Visit Number 1    Authorization - Number of Visits 10    PT Start Time 1115    PT Stop Time 1200    PT Time Calculation (min) 45 min    Activity Tolerance Patient tolerated treatment well    Behavior During Therapy [facility] for tasks assessed/performed           Past Medical History:  Diagnosis Date  . Car sickness   . Environmental allergies     Past Surgical History:  Procedure Laterality Date  . DENTAL SURGERY    . TONSILLECTOMY AND ADENOIDECTOMY N/A 08/06/2017   Procedure: TONSILLECTOMY AND POSSIBLE ADENOIDECTOMY;  Surgeon: Linus Salmons, MD;  Location: [facility];  Service: ENT;  Laterality: N/A;    There were no vitals filed for this visit.    Subjective Assessment - 07/11/20 1115    Subjective Pt. is 24 y.o. female with low back pain after falling off horse on 9/14. Pt. did not immediately go to MD, she was trying to ice it at home. It did not improve so she saw MD. Pt states that the pain is improving, but still hurts when she moves or tries to exercise. Pain is impacting sleep. Pt. states current pain is 4/10. Pt. states pain gets to 8/10 during exercise. Ice improved pain, has not tried other modalities.    Limitations Walking    How long can you walk comfortably? 1-2 hours    Patient Stated Goals Return to prior function and being able to work out.    Currently in Pain? Yes    Pain Score 4     Pain Location Back    Pain Orientation Right;Left            OBJECTIVE  Mental Status Patient is  oriented to person, place and time.  Recent memory is intact.  Remote memory is intact.  Attention span and concentration are intact.  Expressive speech is intact.  Patient's fund of knowledge is within normal limits for educational level.   MUSCULOSKELETAL: Tremor: None Bulk: Normal Tone: Normal No visible step-off along spinal column   Palpation Trigger points noted in bilat lower thoracic paraspinals to lumbar paraspinals   Strength (out of 5) R/L 5/4+ Hip flexion 5/5 Hip ER 5/5 Hip IR 5/5 Hip abduction 5/5 Hip adduction 5/5 Hip extension 5/5 Knee extension 5/5 Knee flexion  *Indicates pain   AROM (degrees) Normal spinal range of motion, pain limited at 40 deg flexion.  Repeated Movements No centralization or peripheralization of symptoms with repeated lumbar extension or flexion.    Passive Accessory Intervertebral Motion (PAIVM) Pain with CPAs L1-L3, no hypomobility noted.    Special Test: Slump: negative  Hip: FABER (SN 81): negative bilat  Pain Provocation Cluster for SIJ Dysfunction Thigh Thrust Test: positive bilat  Gaenslen's Test: positive bilat Distraction Test: negative Compression Test: negative Sacral Thrust Test: positive  3 (+) tests: if discogenic pain has been ruled out through repeated extensions; SN 94, -LR 0.80  FOTO: 56/80  TREATMENT  Supine TrA activation: 5x with verbal and tactile cues to complete  Supine TrA activation with heel slides and marches: 10x each, verbal and tactile cues throughout  Supine TrA activation bridges: 10x, cueing for increased TrA activation.      [facility] PT Assessment - 07/12/20 0001      Assessment   Medical Diagnosis MId/low back pain    Referring Provider (PT) Earma Reading, MD    Onset Date/Surgical Date 06/11/20    Prior Therapy No      Prior Function   Level of Independence Independent                 PT Education - 07/11/20 1219    Education Details pt. given core progression  handout    Person(s) Educated Patient    Methods Explanation;Demonstration;Tactile cues;Verbal cues;Handout    Comprehension Verbalized understanding               PT Long Term Goals - 07/11/20 1333      PT LONG TERM GOAL #1   Title Pt. will improve FOTO score to 80 to improve overall pain free functional mobility.    Baseline 10/14: 56    Time 8    Period Weeks    Status New    Target Date 09/05/20      PT LONG TERM GOAL #2   Title Pt. will report ability to ride at least 6 horses per day without increase in back pain above 5/10 pain to impove ability to complete work related tasks.    Baseline 10/14: pt currently only able to ride about 3 horses/day with pain at 8/10    Time 8    Period Weeks    Status New    Target Date 09/05/20      PT LONG TERM GOAL #3   Title Pt. will demonstrate full pain free spinal AROM to improve ability to complete ADLs without pain.    Baseline 10/14: limited flexion to 40 deg    Time 8    Period Weeks    Status New    Target Date 09/05/20      PT LONG TERM GOAL #4   Title Pt. will report ability to return to exercising without pain increasing above 3/10 to improve functional mobility.    Baseline 10/14: unable to complete exercise without pain at 8/10    Time 8    Period Weeks    Status New    Target Date 09/05/20                  Plan - 07/11/20 1315    Clinical Impression Statement Pt. is a 24 y.o. female with low back pain after a fall off a horse 06/11/2020. Pt. states pain at rest is usually about 4/10, and can reach 8/10 pain with activity. Pt. demonstrates slightly limited spinal ROM in flex and ext. Pt. limited by pain at about 40 deg flex. Pt. did not experience change in sx with repeated movements. Pt. demonstrates grossly strong bilat LE, slightly weaker L hip flexion at 4+/5. Resisted hip motions reproducted back pain. Negative slump test bilat, no sx reported going down either leg. Pt. negative for FABER bilat. Pt.  positive bilat for Gans[place], sacral thrust, and thigh thrust, experienced reproduction of concurrent pain. Pt. tender to palpation along low thoracic through lumbar paraspinals. Pt. will benefit from skilled PT to improve strength of core muslces to decrease pain throughout work activities  and exercise.    Examination-Activity Limitations Bend    Examination-Participation Restrictions Occupation    Stability/Clinical Decision Making Stable/Uncomplicated    Clinical Decision Making Low    Rehab Potential Good    PT Frequency Other (comment)   1-2x/week   PT Duration 8 weeks    PT Treatment/Interventions ADLs/Self Care Home Management;Cryotherapy;Gait training;Stair training;Functional mobility training;Therapeutic exercise;Therapeutic activities;Neuromuscular re-education;Patient/family education;Manual techniques    PT Next Visit Plan core progression    PT Home Exercise Plan given core progression handout    Consulted and Agree with Plan of Care Patient           Patient will benefit from skilled therapeutic intervention in order to improve the following deficits and impairments:  Decreased activity tolerance, Decreased endurance, Decreased range of motion, Decreased mobility, Decreased strength, Impaired flexibility, Increased muscle spasms, Improper body mechanics, Postural dysfunction, Pain  Visit Diagnosis: Acute bilateral low back pain without sciatica  Decreased mobility  Decreased functional mobility and endurance     Problem List Patient Active Problem List   Diagnosis Date Noted  . Traumatic intracranial hemorrhage without loss of consciousness (HCC) 04/23/2020  . Open fracture of temporal bone (HCC) 04/23/2020  . Pneumocephalus, traumatic 04/23/2020  . Temporal bone fracture (HCC) 04/23/2020   Cammie Mcgee, PT, DPT # [id number] Sharyn Creamer, SPT 07/12/2020, 5:21 PM  [place] [facility] [address] [place], [place],  [address] Phone: [phone]   Fax:  [phone]  Name: Kerri Moss MRN: [id number] Date of Birth: 19-Jun-1996

## 2020-07-15 ENCOUNTER — Encounter: Payer: Federal, State, Local not specified - PPO | Admitting: Physical Therapy

## 2020-07-17 ENCOUNTER — Ambulatory Visit: Payer: Federal, State, Local not specified - PPO | Admitting: Physical Therapy

## 2020-07-18 ENCOUNTER — Encounter: Payer: Federal, State, Local not specified - PPO | Admitting: Physical Therapy

## 2021-04-23 DIAGNOSIS — H0011 Chalazion right upper eyelid: Secondary | ICD-10-CM | POA: Diagnosis not present

## 2021-05-27 IMAGING — CT CT HEAD W/O CM
4 series · 15 of 47 positions shown, 17 images · non-contrast
Comparison: No pertinent prior exams are available for comparison.

CLINICAL DATA: Head trauma, focal neuro findings. Poly trauma,
critical, head/cervical spine injury suspected. Additional history
provided: Fall off horse.

EXAM:
CT HEAD WITHOUT CONTRAST
CT CERVICAL SPINE WITHOUT CONTRAST
TECHNIQUE: Multidetector CT imaging of the head and cervical spine was
performed following the standard protocol without intravenous
contrast. Multiplanar CT image reconstructions of the cervical spine
were also generated.

[Series 1: head without · axial · non-contrast · 0.43mm/px · z∈[+1415,+1530]mm · 7 of 31 slices shown, 9 images]
[im 4/31  brain]
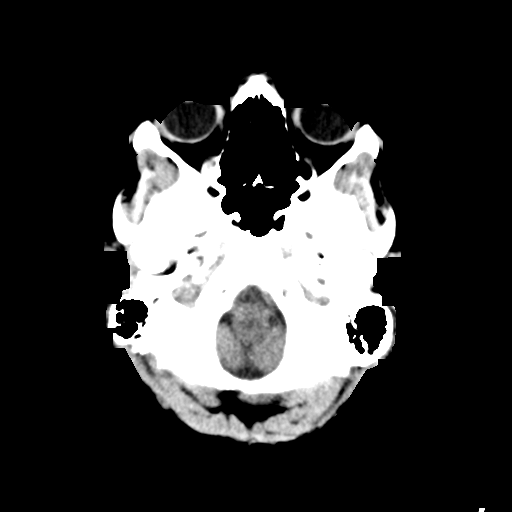
[im 4/31  bone]
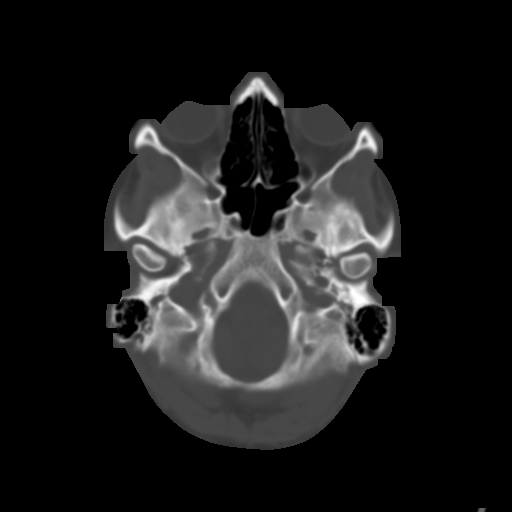
[im 8/31  brain]
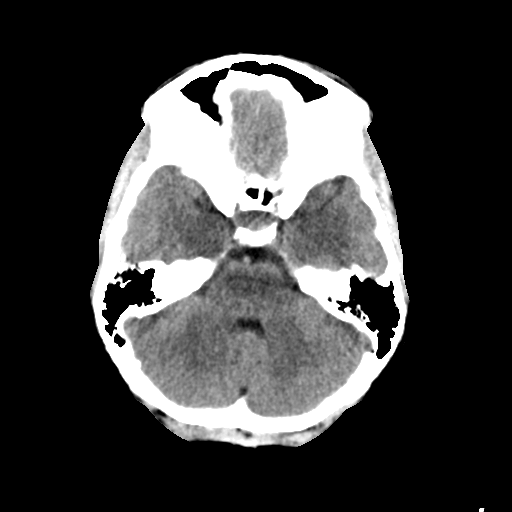
[im 12/31  brain]
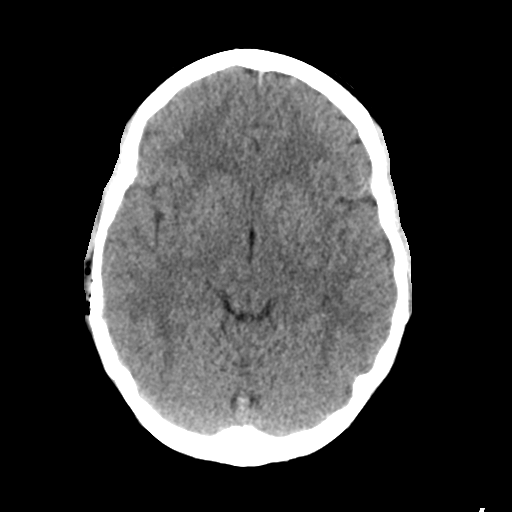
[im 16/31  brain]
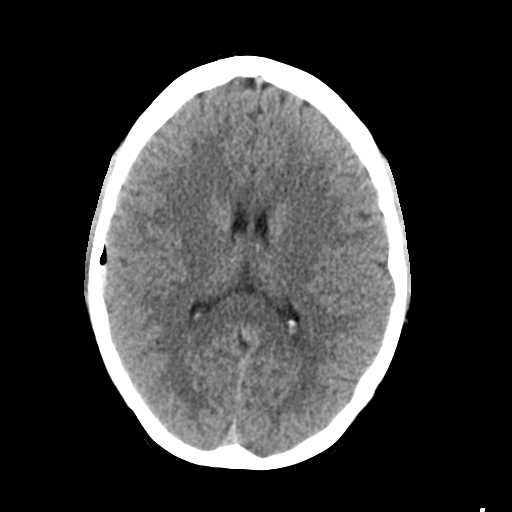
[im 19/31  brain]
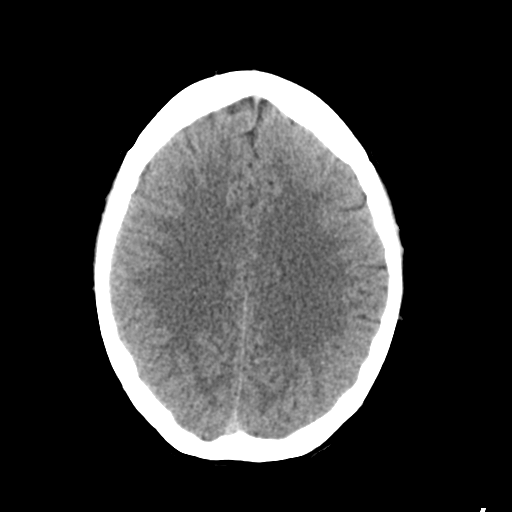
[im 19/31  bone]
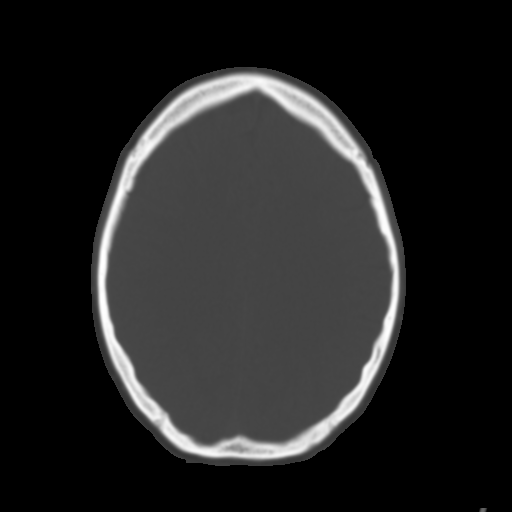
[im 23/31  brain]
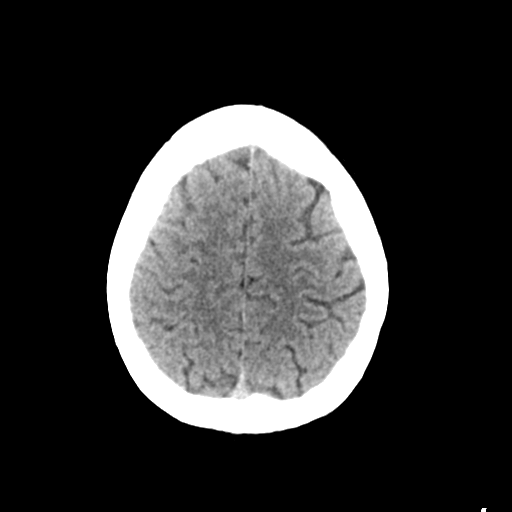
[im 27/31  brain]
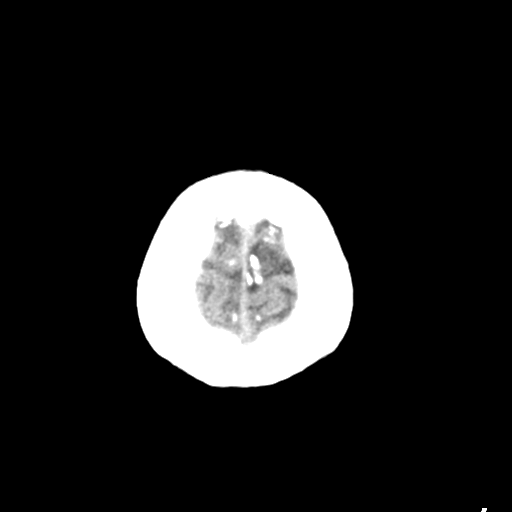

[Series 4: head bone · axial · 0.43mm/px · z∈[+1414,+1430]mm · 2 of 78 slices shown]
[im 8/78  bone]
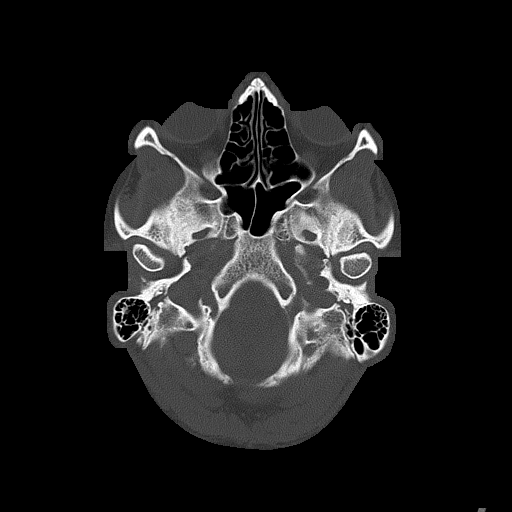
[im 16/78  bone]
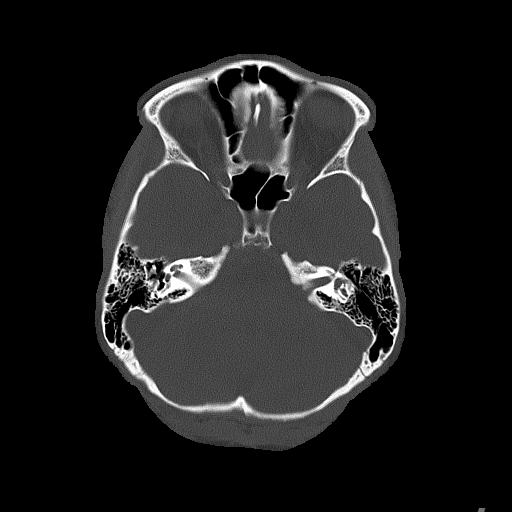

[Series 5: head without cor · coronal · non-contrast · 0.33mm/px · 3 of 67 slices shown]
[im 23/67  brain]
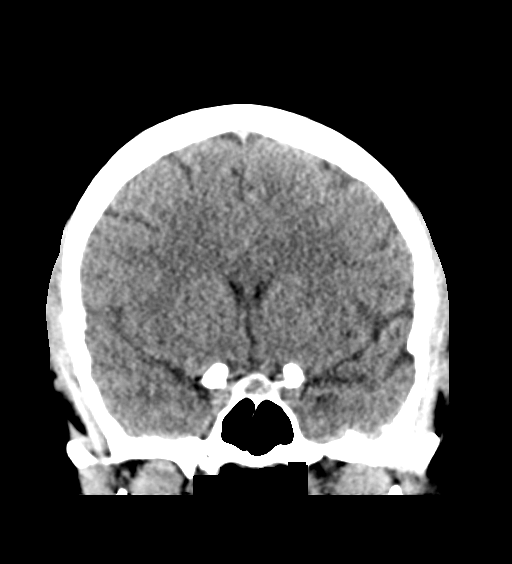
[im 30/67  brain]
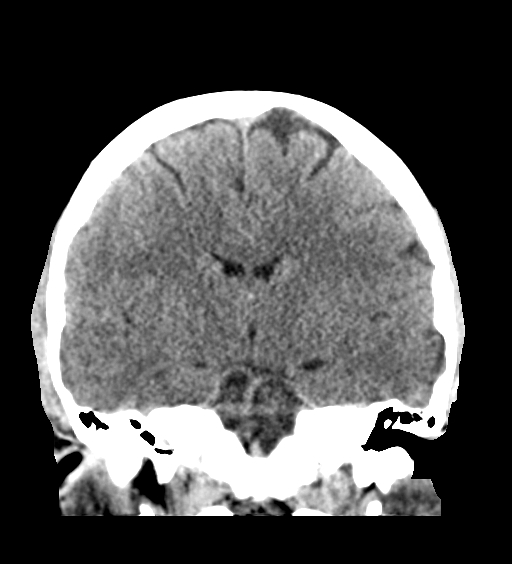
[im 37/67  brain]
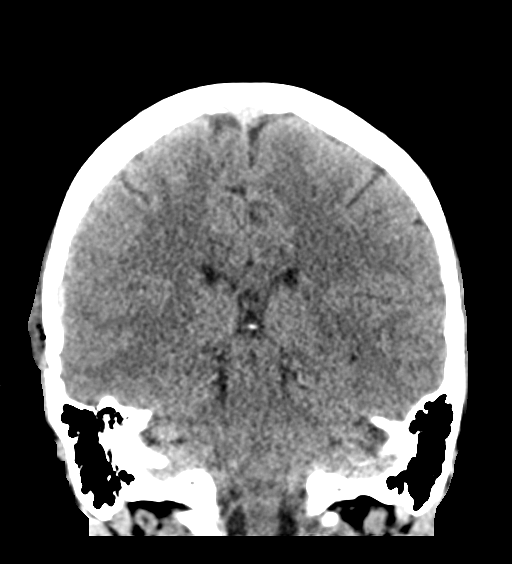

[Series 6: head without sag · sagittal · non-contrast · 0.30mm/px · 3 of 57 slices shown]
[im 19/57  brain]
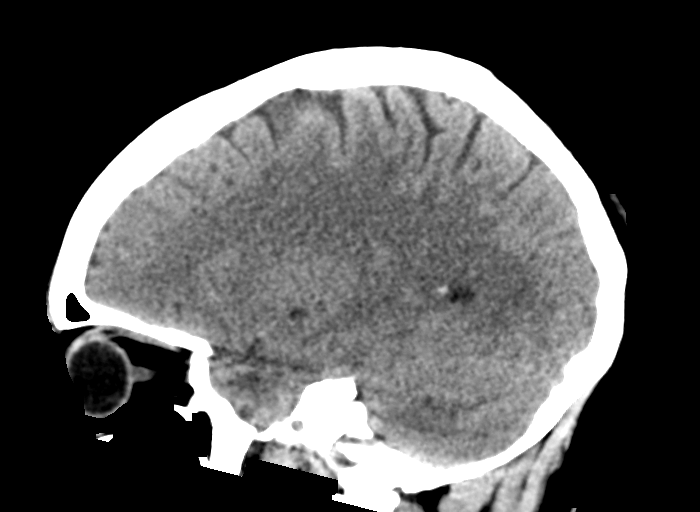
[im 29/57  brain]
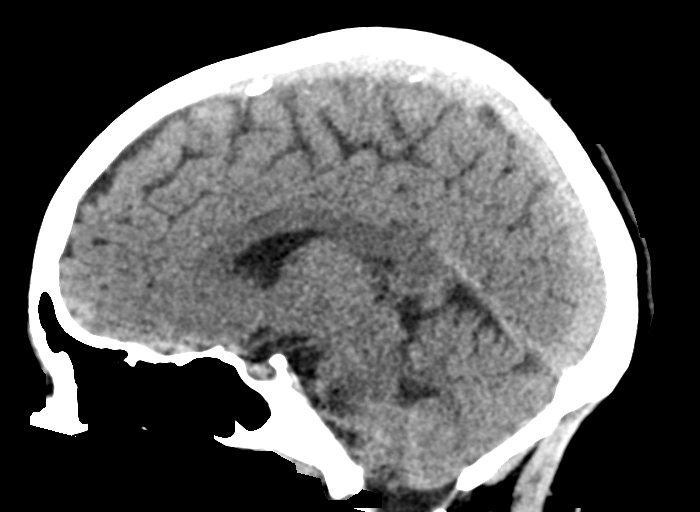
[im 38/57  brain]
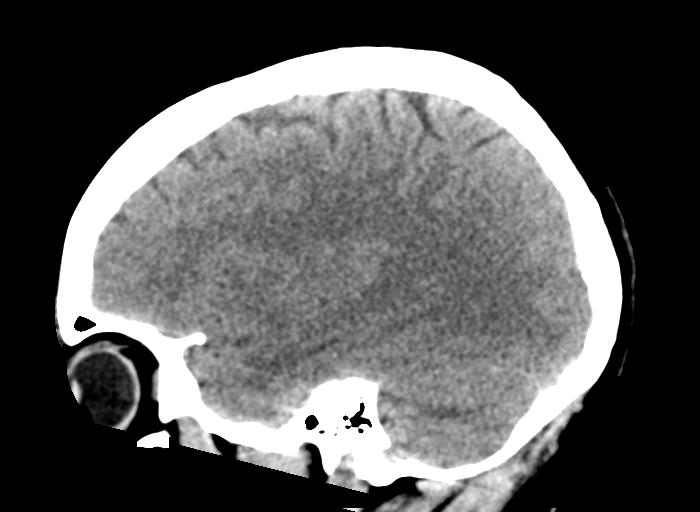

[15 of 47 positions shown; findings below may reference images not displayed]

FINDINGS: CT HEAD FINDINGS

Brain:

There is thin acute extra-axial hematoma and a small amount of
extra-axial pneumocephalus overlying the lateral right
temporoparietal lobes. The hemorrhage may be subdural or epidural
and measures up to 3 mm in greatest thickness (for instance as seen
on series 1, image 15). There is an overlying mildly depressed
fracture of the squamosal right temporal bone (for instance as seen
on series 5, image 38).

Cerebral volume is normal.

No demarcated cortical infarct.

No evidence of intracranial mass.

No midline shift.

Vascular: No hyperdense vessel.

Skull: Mildly depressed acute fracture of the squamosal right
temporal bone.

Sinuses/Orbits: Visualized orbits show no acute finding. No
significant paranasal sinus disease or mastoid effusion.

Other: Subcutaneous gas and soft tissue swelling on the right,
overlying the right temporal bone fracture.

CT CERVICAL SPINE FINDINGS

Alignment: Mild nonspecific reversal of the expected cervical
lordosis.

Skull base and vertebrae: The basion-dental and atlanto-dental
intervals are maintained.No evidence of acute fracture to the
cervical spine.

Soft tissues and spinal canal: No prevertebral fluid or swelling. No
visible canal hematoma.

Disc levels: No significant bony spinal canal or neural foraminal
narrowing at any level.

Upper chest: No consolidation within the imaged lung apices. No
visible pneumothorax.

These results were called by telephone at the time of interpretation
on 04/23/2020 at [DATE] to provider Dr. Hosinger, who verbally
acknowledged these results.
IMPRESSION: CT head:

There is small-volume acute extra-axial hemorrhage and small amount
of pneumocephalus overlying the right temporoparietal lobes. The
hemorrhage measures up to 3 mm in thickness and may be subdural or
epidural in location. Overlying mildly depressed acute fracture of
the squamosal right temporal bone. Overlying scalp soft tissue
swelling and subcutaneous gas.

CT cervical spine:

1. No evidence of acute fracture to the cervical spine.
2. Nonspecific reversal of the expected cervical lordosis.

## 2021-06-28 DIAGNOSIS — A749 Chlamydial infection, unspecified: Secondary | ICD-10-CM

## 2021-06-28 HISTORY — DX: Chlamydial infection, unspecified: A74.9

## 2021-07-02 ENCOUNTER — Encounter: Payer: Self-pay | Admitting: Podiatry

## 2021-07-02 ENCOUNTER — Other Ambulatory Visit: Payer: Self-pay

## 2021-07-02 ENCOUNTER — Ambulatory Visit: Payer: Federal, State, Local not specified - PPO | Admitting: Podiatry

## 2021-07-02 ENCOUNTER — Ambulatory Visit (INDEPENDENT_AMBULATORY_CARE_PROVIDER_SITE_OTHER): Payer: Federal, State, Local not specified - PPO

## 2021-07-02 DIAGNOSIS — B070 Plantar wart: Secondary | ICD-10-CM | POA: Diagnosis not present

## 2021-07-02 DIAGNOSIS — M778 Other enthesopathies, not elsewhere classified: Secondary | ICD-10-CM | POA: Diagnosis not present

## 2021-07-02 DIAGNOSIS — S065XAA Traumatic subdural hemorrhage with loss of consciousness status unknown, initial encounter: Secondary | ICD-10-CM | POA: Insufficient documentation

## 2021-07-02 MED ORDER — FLUOROURACIL 5 % EX CREA
TOPICAL_CREAM | Freq: Two times a day (BID) | CUTANEOUS | 1 refills | Status: DC
Start: 2021-07-02 — End: 2022-01-26

## 2021-07-02 NOTE — Progress Notes (Signed)
  Subjective:  Patient ID: Kerri Moss, female    DOB: 1996/01/28,  MRN: [id number] HPI Chief Complaint  Patient presents with   Foot Pain    Plantar forefoot left - small callused area x several months, getting thicker, certain activities are uncomfortable   New Patient (Initial Visit)    25 y.o. female presents with the above complaint.   ROS: Denies fever chills nausea Muscle aches pains calf pain back pain chest pain shortness of breath.  Past Medical History:  Diagnosis Date   Car sickness    Environmental allergies    Past Surgical History:  Procedure Laterality Date   DENTAL SURGERY     TONSILLECTOMY AND ADENOIDECTOMY N/A 08/06/2017   Procedure: TONSILLECTOMY AND POSSIBLE ADENOIDECTOMY;  Surgeon: Linus Salmons, MD;  Location: [facility];  Service: ENT;  Laterality: N/A;    Current Outpatient Medications:    fluorouracil (EFUDEX) 5 % cream, Apply topically 2 (two) times daily., Disp: 40 g, Rfl: 1   cetirizine (ZYRTEC) 10 MG tablet, Take 10 mg by mouth daily as needed for allergies or rhinitis. , Disp: , Rfl:    EPINEPHrine 0.3 mg/0.3 mL IJ SOAJ injection, Inject as directed See admin instructions., Disp: , Rfl:   Allergies  Allergen Reactions   Other Shortness Of Breath and Other (See Comments)    Seasonal allergies- Neb treatments when younger (shots, too)  Severe environmental allergies   Review of Systems Objective:  There were no vitals filed for this visit.  General: Well developed, nourished, in no acute distress, alert and oriented x3   Dermatological: Skin is warm, dry and supple bilateral. Nails x 10 are well maintained; remaining integument appears unremarkable at this time. There are no open sores, no preulcerative lesions, no rash or signs of infection present.  Solitary porokeratotic type lesion.  The skin lines do circumvent the lesion this is probably an early verrucoid lesion.  No thrombosed capillaries are noted yet.  Vascular:  Dorsalis Pedis artery and Posterior Tibial artery pedal pulses are 2/4 bilateral with immedate capillary fill time. Pedal hair growth present. No varicosities and no lower extremity edema present bilateral.   Neruologic: Grossly intact via light touch bilateral. Vibratory intact via tuning fork bilateral. Protective threshold with Semmes Wienstein monofilament intact to all pedal sites bilateral. Patellar and Achilles deep tendon reflexes 2+ bilateral. No Babinski or clonus noted bilateral.   Musculoskeletal: No gross boney pedal deformities bilateral. No pain, crepitus, or limitation noted with foot and ankle range of motion bilateral. Muscular strength 5/5 in all groups tested bilateral.  Gait: Unassisted, Nonantalgic.    Radiographs:  Radiographs taken today do not demonstrate any type of acute findings.  Assessment & Plan:   Assessment: Probable juvenile wart.  Plan: Started her on Efudex cream after debridement of the lesion today to be applied twice daily and covered I will follow-up with her in 6 weeks if not improved we will consider surgical curettage at that time.     Claud Gowan T. [place], [place]

## 2021-07-09 ENCOUNTER — Other Ambulatory Visit (HOSPITAL_COMMUNITY)
Admission: RE | Admit: 2021-07-09 | Discharge: 2021-07-09 | Disposition: A | Discharge status: Home / Self Care | Payer: Federal, State, Local not specified - PPO | Source: Ambulatory Visit | Attending: Advanced Practice Midwife | Admitting: Advanced Practice Midwife

## 2021-07-09 ENCOUNTER — Ambulatory Visit: Payer: Federal, State, Local not specified - PPO | Admitting: Advanced Practice Midwife

## 2021-07-09 ENCOUNTER — Encounter: Payer: Self-pay | Admitting: Advanced Practice Midwife

## 2021-07-09 ENCOUNTER — Other Ambulatory Visit: Payer: Self-pay

## 2021-07-09 VITALS — BP 111/66 | HR 63 | Ht 67.0 in | Wt 152.0 lb

## 2021-07-09 DIAGNOSIS — Z01419 Encounter for gynecological examination (general) (routine) without abnormal findings: Secondary | ICD-10-CM

## 2021-07-09 DIAGNOSIS — Z113 Encounter for screening for infections with a predominantly sexual mode of transmission: Secondary | ICD-10-CM | POA: Insufficient documentation

## 2021-07-09 NOTE — Progress Notes (Signed)
GYNECOLOGY ANNUAL PREVENTATIVE CARE ENCOUNTER NOTE  History:     Kerri Moss is a 25 y.o. G0P0 female here for a routine annual gynecologic exam.  Current complaints: Mild nausea with onset of menstrual cycle. Occurs about 3 times per year.   Denies abnormal vaginal bleeding, discharge, pelvic pain, problems with intercourse or other gynecologic concerns.   Endorses irregular THC use, no tobacco use, no vaping. Female partners, penetrative sex, no SI, HI, IPV. Intermittent condom use, irregular use of foam. Works with saddlebred horses.   Gynecologic History Patient's last menstrual period was 07/01/2021 (approximate). Contraception: condoms and vaginal spermicide Last Pap: No pap history.   Obstetric History OB History  No obstetric history on file.    Past Medical History:  Diagnosis Date   Car sickness    Environmental allergies     Past Surgical History:  Procedure Laterality Date   DENTAL SURGERY     TONSILLECTOMY AND ADENOIDECTOMY N/A 08/06/2017   Procedure: TONSILLECTOMY AND POSSIBLE ADENOIDECTOMY;  Surgeon: Linus Salmons, MD;  Location: [facility];  Service: ENT;  Laterality: N/A;    Current Outpatient Medications on File Prior to Visit  Medication Sig Dispense Refill   cetirizine (ZYRTEC) 10 MG tablet Take 10 mg by mouth daily as needed for allergies or rhinitis.      EPINEPHrine 0.3 mg/0.3 mL IJ SOAJ injection Inject as directed See admin instructions.     fluorouracil (EFUDEX) 5 % cream Apply topically 2 (two) times daily. (Patient not taking: Reported on 07/09/2021) 40 g 1   No current facility-administered medications on file prior to visit.    Allergies  Allergen Reactions   Other Shortness Of Breath and Other (See Comments)    Seasonal allergies- Neb treatments when younger (shots, too)  Severe environmental allergies    Social History:  reports that she has never smoked. She has never used smokeless tobacco. She reports current alcohol  use. She reports that she does not use drugs.  Family History  Problem Relation Age of Onset   Diabetes Father    Heart disease Father    Diabetes Paternal Grandfather    Heart disease Paternal Grandfather     The following portions of the patient's history were reviewed and updated as appropriate: allergies, current medications, past family history, past medical history, past social history, past surgical history and problem list.  Review of Systems Pertinent items noted in HPI and remainder of comprehensive ROS otherwise negative.  Physical Exam:  BP 111/66   Pulse 63   Ht 5\' 7"  (1.702 m)   Wt 152 lb (68.9 kg)   LMP 07/01/2021 (Approximate)   BMI 23.81 kg/m  CONSTITUTIONAL: Well-developed, well-nourished female in no acute distress.  HENT:  Normocephalic, atraumatic, External right and left ear normal.  EYES: Conjunctivae and EOM are normal. Pupils are equal, round, and reactive to light. No scleral icterus.  NECK: Normal range of motion, supple, no masses.  Normal thyroid.  SKIN: Skin is warm and dry. No rash noted. Not diaphoretic. No erythema. No pallor. MUSCULOSKELETAL: Normal range of motion. No tenderness.  No cyanosis, clubbing, or edema. NEUROLOGIC: Alert and oriented to person, place, and time. Normal reflexes, muscle tone coordination.  PSYCHIATRIC: Normal mood and affect. Normal behavior. Normal judgment and thought content. CARDIOVASCULAR: Normal heart rate noted, regular rhythm RESPIRATORY: Clear to auscultation bilaterally. Effort and breath sounds normal, no problems with respiration noted. BREASTS: Symmetric in size. No masses, tenderness, skin changes, nipple drainage, or lymphadenopathy  bilaterally. Performed in the presence of a chaperone. ABDOMEN: Soft, no distention noted.  No tenderness, rebound or guarding.  PELVIC: Normal appearing external genitalia and urethral meatus; normal appearing vaginal mucosa and cervix.  No abnormal vaginal discharge noted.  Pap  smear obtained.  Performed in the presence of a chaperone.   Assessment and Plan:    1. Well woman exam with routine gynecological exam - No abnormal findings on physical exam - Cytology - PAP( [place])  2. Screening for STD (sexually transmitted disease) - Declines blood work - Advised complete STD workup q 6 months - Cervicovaginal ancillary only( [place])  Will follow up results of pap smear and manage accordingly. Routine preventative health maintenance measures emphasized. Please refer to After Visit Summary for other counseling recommendations.     Total visit time: 30 min. Greater than 50% of visit spent in counseling and coordination of care  Clayton Bibles, MSN, CNM Certified Nurse Midwife, [facility] for [employer], [facility] 07/09/21 3:50 PM

## 2021-07-09 NOTE — Progress Notes (Signed)
NGYN patient presents for    LMP:07/01/21 cycles usually last 5 days with moderate flow with cramps and clots.  Last pap: Never  Contraception: None and pt does not desire any contraception at this time STD Screening: Declined /Unsure  Family Hx of Breast Cancer: None   CC: None

## 2021-07-10 LAB — CERVICOVAGINAL ANCILLARY ONLY
Chlamydia: POSITIVE — AB
Comment: NEGATIVE
Comment: NEGATIVE
Comment: NORMAL
Neisseria Gonorrhea: NEGATIVE
Trichomonas: NEGATIVE

## 2021-07-10 LAB — CYTOLOGY - PAP: Diagnosis: NEGATIVE

## 2021-07-11 ENCOUNTER — Encounter: Payer: Self-pay | Admitting: Certified Nurse Midwife

## 2021-07-11 ENCOUNTER — Telehealth: Payer: Self-pay | Admitting: *Deleted

## 2021-07-11 MED ORDER — DOXYCYCLINE HYCLATE 100 MG PO CAPS: 100.0000 mg | ORAL_CAPSULE | Freq: Two times a day (BID) | ORAL | 0 refills | Status: DC

## 2021-07-11 NOTE — Telephone Encounter (Signed)
Pt informed of results and to let her partner know so that they can get tested and treated as well. Also advised both to refrain from any unprotected sex for at least 7-10 days after treatment. Will send in medication to the pharmacy and pt is aware she can come in in about 4 weeks for a TOC.

## 2021-07-11 NOTE — Telephone Encounter (Signed)
-----   Message from Donette Larry, [place] sent at 07/11/2021  9:32 AM EDT ----- +Chlamydia, needs treatment. Please inform of normal papsmear.

## 2021-07-25 IMAGING — DX DG LUMBAR SPINE 2-3V
3 series · 3 of 3 positions shown · non-contrast
Comparison: None.

CLINICAL DATA: Acute low back pain after falling off horse last
week.

EXAM:
LUMBAR SPINE - 2-3 VIEW

[l-spine ap]
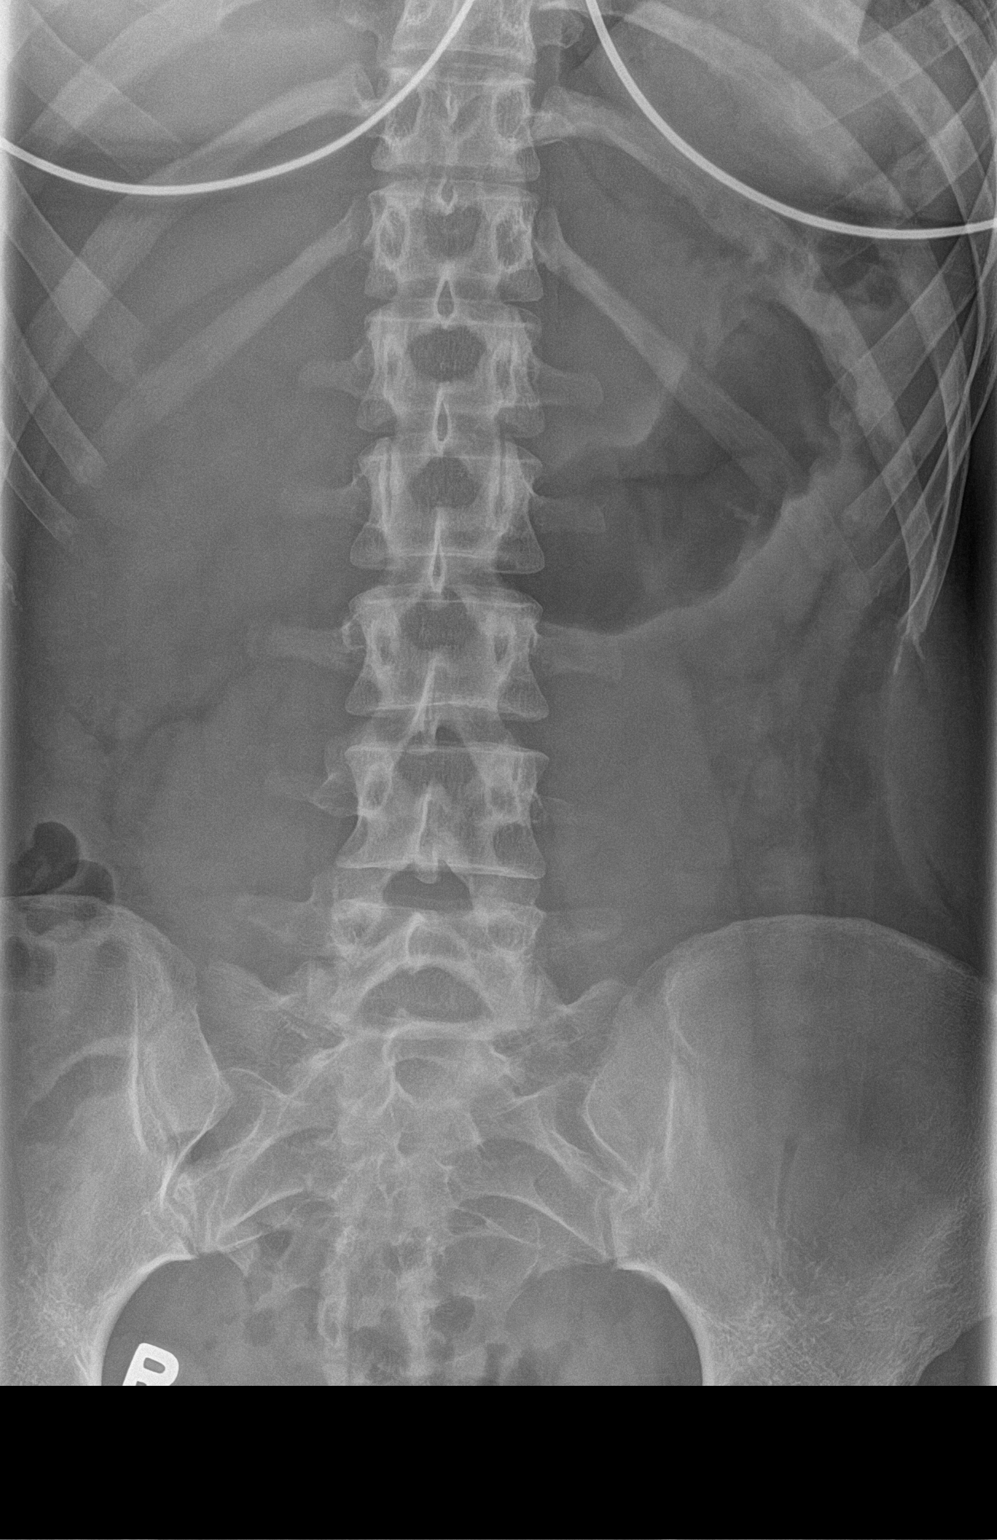

[l-spine lateral (1 of 2)]
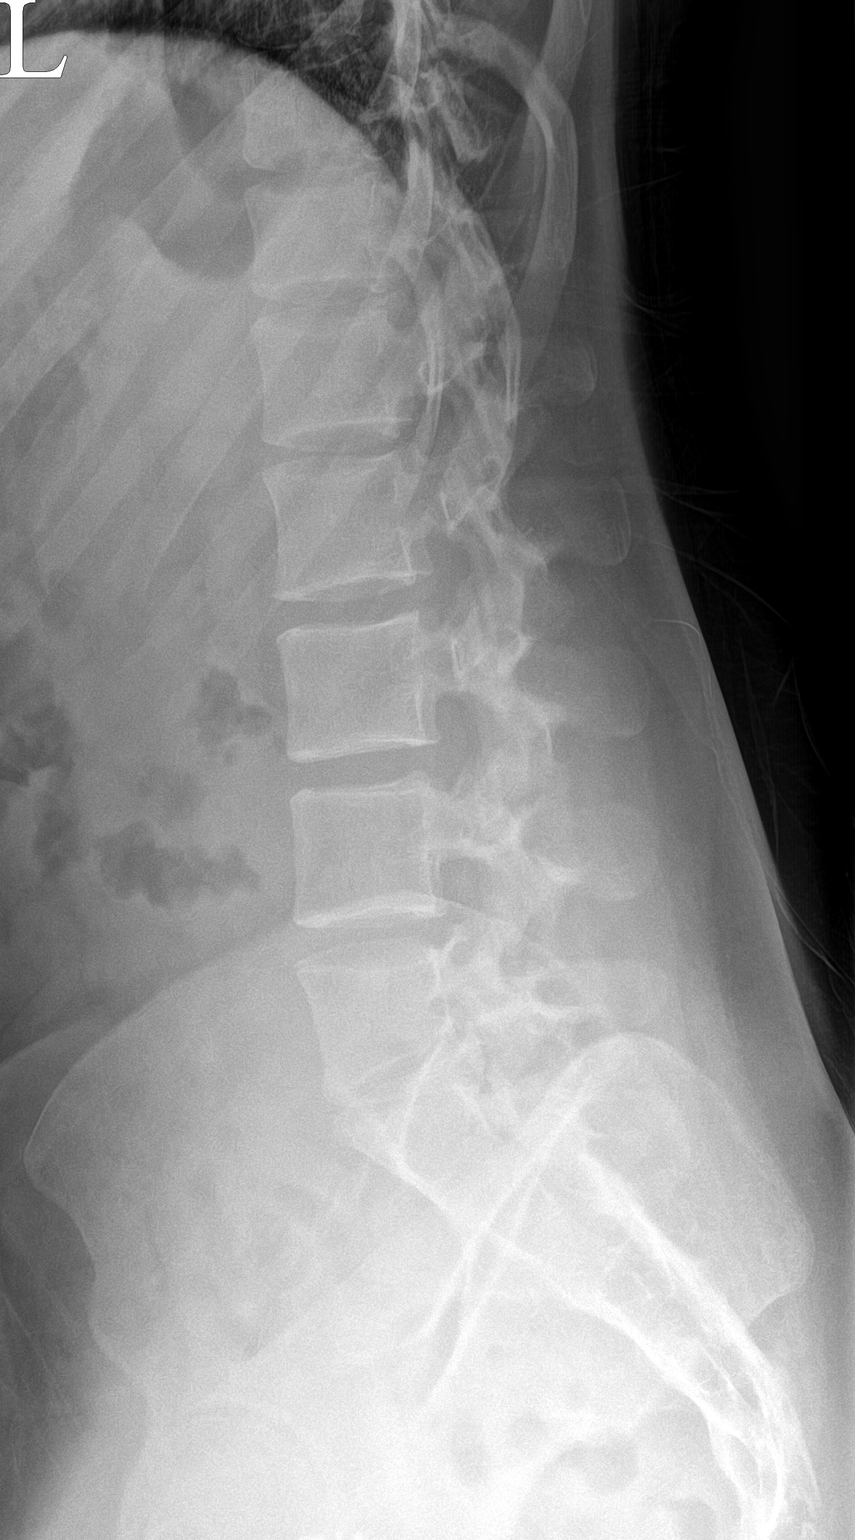

[l-spine lateral (2 of 2)]
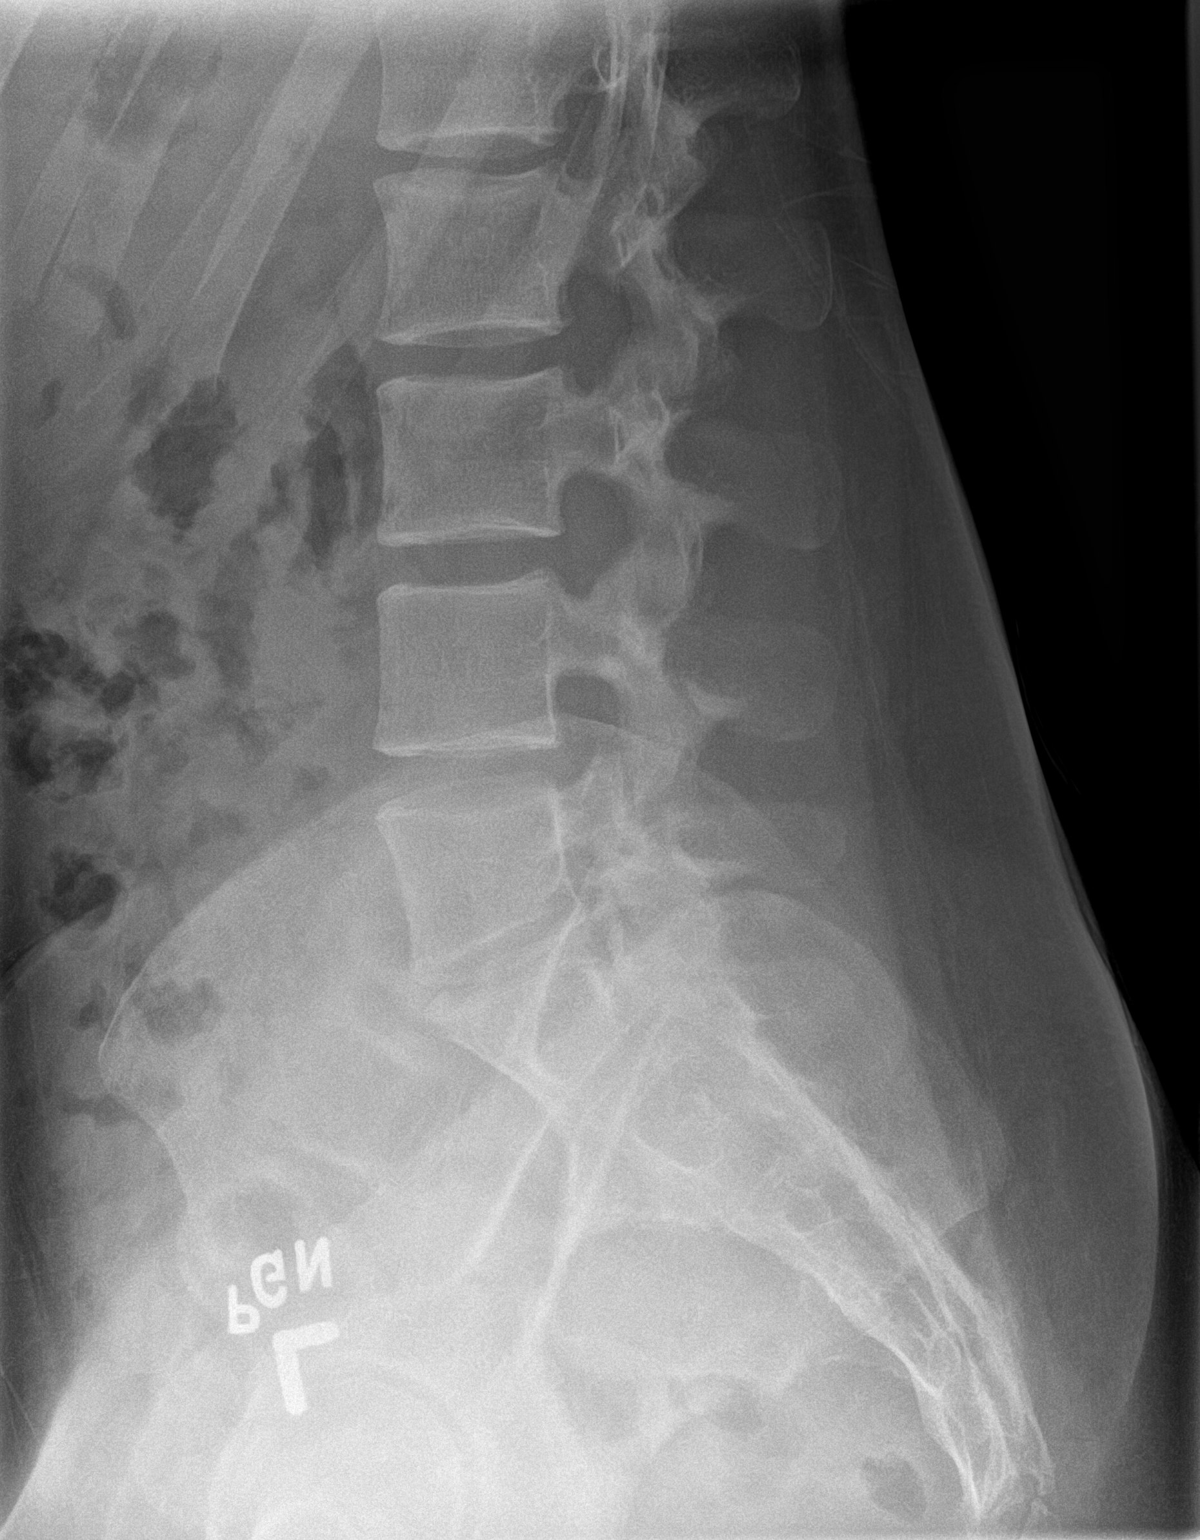

[3 of 3 positions shown; findings below may reference images not displayed]

FINDINGS: There is no evidence of lumbar spine fracture. Alignment is normal.
Intervertebral disc spaces are maintained.
IMPRESSION: Negative.

## 2021-08-07 ENCOUNTER — Ambulatory Visit: Payer: Federal, State, Local not specified - PPO

## 2021-08-13 ENCOUNTER — Ambulatory Visit: Payer: Federal, State, Local not specified - PPO | Admitting: Podiatry

## 2021-08-13 ENCOUNTER — Other Ambulatory Visit: Payer: Self-pay

## 2021-08-13 ENCOUNTER — Encounter: Payer: Self-pay | Admitting: Podiatry

## 2021-08-13 DIAGNOSIS — B070 Plantar wart: Secondary | ICD-10-CM | POA: Diagnosis not present

## 2021-08-13 NOTE — Progress Notes (Signed)
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  She states that she continues to apply her Efudex cream on a regular basis and feels that he may be getting smaller.  Objective: Vital signs are stable alert and oriented x3.  Evaluation plantar aspect of her left foot does demonstrate a small area which appears to be a tiny lesion just beneath the second metatarsal of the left foot.  Thrombosed capillaries are visible but I was able to debride those today and the lesion nearly resolved.  Assessment: Plantar wart left foot.  Plan: Debridement today with chemical destruction of the lesion and I recommended that she leave this on until tomorrow washed off thoroughly.  Also recommended that she continue the use of the Efudex cream I like to follow-up with her in 6 weeks.  I did discuss with her possibility of performing a matrixectomy to the tibial border of her hallux left.

## 2021-08-25 ENCOUNTER — Other Ambulatory Visit: Payer: Self-pay

## 2021-08-25 ENCOUNTER — Ambulatory Visit (INDEPENDENT_AMBULATORY_CARE_PROVIDER_SITE_OTHER): Payer: Federal, State, Local not specified - PPO | Admitting: *Deleted

## 2021-08-25 ENCOUNTER — Other Ambulatory Visit (HOSPITAL_COMMUNITY)
Admission: RE | Admit: 2021-08-25 | Discharge: 2021-08-25 | Disposition: A | Discharge status: Home / Self Care | Payer: Federal, State, Local not specified - PPO | Source: Ambulatory Visit | Attending: Obstetrics and Gynecology | Admitting: Obstetrics and Gynecology

## 2021-08-25 VITALS — BP 113/68 | HR 59

## 2021-08-25 DIAGNOSIS — Z8619 Personal history of other infectious and parasitic diseases: Secondary | ICD-10-CM | POA: Insufficient documentation

## 2021-08-25 NOTE — Progress Notes (Signed)
SUBJECTIVE:  25 y.o. female who is her for a TOC.  Denies abnormal vaginal discharge, bleeding or significant pelvic pain.   No LMP recorded.  OBJECTIVE:  She appears well.   ASSESSMENT:  TOC  PLAN:  Pt offered STI blood screening-not indicated Chlamydia probe sent to lab.  Treatment: To be determined once lab results are received.  Pt follow up as needed.

## 2021-08-26 NOTE — Progress Notes (Signed)
Patient was assessed and managed by nursing staff during this encounter. I have reviewed the chart and agree with the documentation and plan. I have also made any necessary editorial changes.  Catalina Antigua, MD 08/26/2021 11:12 AM

## 2021-08-27 LAB — CERVICOVAGINAL ANCILLARY ONLY
Chlamydia: NEGATIVE
Comment: NEGATIVE
Comment: NORMAL
Neisseria Gonorrhea: NEGATIVE

## 2021-09-24 ENCOUNTER — Ambulatory Visit: Payer: Federal, State, Local not specified - PPO | Admitting: Podiatry

## 2021-10-15 ENCOUNTER — Encounter: Payer: Self-pay | Admitting: Podiatry

## 2021-10-15 ENCOUNTER — Ambulatory Visit (INDEPENDENT_AMBULATORY_CARE_PROVIDER_SITE_OTHER): Payer: Federal, State, Local not specified - PPO | Admitting: Podiatry

## 2021-10-15 ENCOUNTER — Other Ambulatory Visit: Payer: Self-pay

## 2021-10-15 DIAGNOSIS — B070 Plantar wart: Secondary | ICD-10-CM | POA: Diagnosis not present

## 2021-10-15 DIAGNOSIS — L600 Ingrowing nail: Secondary | ICD-10-CM | POA: Diagnosis not present

## 2021-10-15 MED ORDER — NEOMYCIN-POLYMYXIN-HC 1 % OT SOLN: OTIC | 1 refills | Status: DC

## 2021-10-15 NOTE — Patient Instructions (Signed)

## 2021-10-15 NOTE — Progress Notes (Signed)
She presents today for follow-up of wart plantar aspect of her left foot continues to use the Efudex cream on a daily basis.  States that her ingrown's are starting to bother her more and more every day and she would like to see about having those removed.  Objective: Vital signs are stable she is alert oriented x3 wart appears to have been dissolved nearly 100%.  She had sharply abraded nail margin along the tibial border of the hallux bilaterally with no signs of infection.  Exquisitely tender on palpation.  Assessment: Ingrown nail tibial border hallux bilaterally.  Resolving wart plantar aspect left foot.  Plan: At this point I recommend she continue the Efudex just for another week or 2 and then also went ahead and performed chemical matricectomy to the hallux bilaterally today.  She tolerated procedure well without complications and she was given both oral and written home-going instructions for care and soaking of the toe as well as a prescription for Cortisporin Otic to be applied twice daily after soaking.  I like to follow-up with her in 2 to 3 weeks.

## 2021-11-05 ENCOUNTER — Ambulatory Visit (INDEPENDENT_AMBULATORY_CARE_PROVIDER_SITE_OTHER): Payer: Federal, State, Local not specified - PPO | Admitting: Podiatry

## 2021-11-05 ENCOUNTER — Other Ambulatory Visit: Payer: Self-pay

## 2021-11-05 ENCOUNTER — Encounter: Payer: Self-pay | Admitting: Podiatry

## 2021-11-05 DIAGNOSIS — L600 Ingrowing nail: Secondary | ICD-10-CM

## 2021-11-05 DIAGNOSIS — Z9889 Other specified postprocedural states: Secondary | ICD-10-CM

## 2021-11-05 NOTE — Progress Notes (Signed)
She presents today for follow-up of ingrown toenails, tibial borders, bilateral. She says they are doing good, but the right one is still tender.  Objective: Vital signs are stable she is alert oriented. She has some mild erythema tibial border of the right hallux. Mild tenderness. The left hallux seems to be healing well. She has discontinued soaking and covering at this time.  Assessment: Status post matrixectomy tibial border hallux bilateral.  Plan: I recommended she continue soaking in epsom salts and warm water and apply neosporin and bandaid daily until dry scab forms and redness subsides. Reviewed signs of infection and if any of these arise she should call the office for possible need of antibiotics. She will follow up in 2 weeks if not completely healed.

## 2021-11-19 ENCOUNTER — Ambulatory Visit: Payer: Federal, State, Local not specified - PPO | Admitting: Podiatry

## 2021-12-29 DIAGNOSIS — J309 Allergic rhinitis, unspecified: Secondary | ICD-10-CM | POA: Diagnosis not present

## 2022-01-26 ENCOUNTER — Ambulatory Visit: Payer: Federal, State, Local not specified - PPO | Admitting: Nurse Practitioner

## 2022-01-26 ENCOUNTER — Encounter: Payer: Self-pay | Admitting: Nurse Practitioner

## 2022-01-26 VITALS — BP 108/64 | HR 87 | Temp 97.8°F | Resp 12 | Ht 67.0 in | Wt 157.0 lb

## 2022-01-26 DIAGNOSIS — Z0000 Encounter for general adult medical examination without abnormal findings: Secondary | ICD-10-CM | POA: Diagnosis not present

## 2022-01-26 NOTE — Assessment & Plan Note (Signed)
Discussed age-appropriate immunizations and screening exams.  Patient to continue healthy lifestyle ?

## 2022-01-26 NOTE — Patient Instructions (Signed)
Nice to see you today I will be in touch with the lab results.  Follow up with me in 1 year for your physical, sooner if you need me  

## 2022-01-26 NOTE — Progress Notes (Signed)
? ?New Patient Office Visit ? ?Subjective   ? ?Patient ID: Kerri Moss, female    DOB: [birth year]  Age: 26 y.o. MRN: [id number] ? ?CC:  ?Chief Complaint  ?Patient presents with  ? Establish Care  ?  Previous PCP with [facility].  ? ? ?HPI ?Kerri Moss presents to establish care ? ?for complete physical and follow up of chronic conditions. ? ?Immunizations: ?-Tetanus: up to date ?-Influenza: never  ?-Covid-19: refused ?-Shingles: NA ?-Pneumonia: NA ? ?-HPV: utd ? ?Diet: Fair diet. 3 meals a day with some snacking. Water. Will have 1 diet soda a day ?Exercise: 4-5 days a week. ?Will do HITT cardo and 2 day weight approx 1 hour at each work out ? ?Eye exam: PRN ?Dental exam: Completes semi-annually  ? ?Pap Smear: Completed in 07/09/2021 ?Mammogram: NA ?Colonoscopy NA ?Dexa: NA ? ? ?Lung Cancer Screening: NA   ? ?Sleep:Gets up at 5am and will try to go to ned around 10pm. Sometimes later. Feels rested. Does not snore to her knowledge ? ?Lives with a roommate on her own ?Sexually active and uses condom sometimes not. No concern for STIs per patient report ? ?Outpatient Encounter Medications as of 01/26/2022  ?Medication Sig  ? cetirizine (ZYRTEC) 10 MG tablet Take 10 mg by mouth daily as needed for allergies or rhinitis.   ? [DISCONTINUED] EPINEPHrine 0.3 mg/0.3 mL IJ SOAJ injection Inject as directed See admin instructions.  ? fluticasone (FLONASE) 50 MCG/ACT nasal spray Place 2 sprays into both nostrils daily. (Patient not taking: Reported on 01/26/2022)  ? [DISCONTINUED] fluorouracil (EFUDEX) 5 % cream Apply topically 2 (two) times daily. (Patient not taking: Reported on 07/09/2021)  ? [DISCONTINUED] NEOMYCIN-POLYMYXIN-HYDROCORTISONE (CORTISPORIN) 1 % SOLN OTIC solution Apply 1-2 drops to toe BID after soaking  ? ?No facility-administered encounter medications on file as of 01/26/2022.  ? ? ?Past Medical History:  ?Diagnosis Date  ? Car sickness   ? Chlamydia 06/2021  ? Environmental allergies   ? ? ?Past Surgical  History:  ?Procedure Laterality Date  ? DENTAL SURGERY    ? TONSILLECTOMY AND ADENOIDECTOMY N/A 08/06/2017  ? Procedure: TONSILLECTOMY AND POSSIBLE ADENOIDECTOMY;  Surgeon: Linus Salmons, MD;  Location: [facility];  Service: ENT;  Laterality: N/A;  ? ? ?Family History  ?Problem Relation Age of Onset  ? Anxiety disorder Mother   ? Obesity Mother   ? Retinal detachment Mother   ? Obesity Father   ? Diabetes Father   ? Heart disease Father   ? Anxiety disorder Sister   ? Obesity Sister   ? Obesity Maternal Aunt   ? Retinal detachment Maternal Aunt   ? Stroke Maternal Grandmother   ? Seizures Maternal Grandmother   ? Heart disease Maternal Grandfather   ? Other Paternal Grandmother   ?     pre diabetic  ? Diabetes Paternal Grandfather   ? Heart disease Paternal Grandfather   ? ? ?Social History  ? ?Socioeconomic History  ? Marital status: Single  ?  Spouse name: Not on file  ? Number of children: 0  ? Years of education: Not on file  ? Highest education level: High school graduate  ?Occupational History  ? Not on file  ?Tobacco Use  ? Smoking status: Never  ? Smokeless tobacco: Never  ?Vaping Use  ? Vaping Use: Never used  ?Substance and Sexual Activity  ? Alcohol use: Yes  ?  Comment: every other week . less than 6 at a time  ?  Drug use: Never  ? Sexual activity: Yes  ?  Partners: Male  ?  Birth control/protection: Condom  ?  Comment: Condoms sometimes  ?Other Topics Concern  ? Not on file  ?Social History Narrative  ? Fulltime: Psychologist, occupationalCoach with horsing  ? ?Social Determinants of Health  ? ?Financial Resource Strain: Not on file  ?Food Insecurity: Not on file  ?Transportation Needs: Not on file  ?Physical Activity: Not on file  ?Stress: Not on file  ?Social Connections: Not on file  ?Intimate Partner Violence: Not on file  ? ? ?Review of Systems  ?Constitutional:  Negative for chills, fever and malaise/fatigue.  ?Respiratory:  Negative for cough and shortness of breath.   ?Cardiovascular:  Negative for chest pain  and leg swelling.  ?Gastrointestinal:  Negative for abdominal pain, nausea (intermittently) and vomiting.  ?     BM daily ?  ?Genitourinary:  Negative for dysuria, frequency and hematuria.  ?     Nocturia negative ?  ?Neurological:  Negative for dizziness, tingling, weakness and headaches.  ?Psychiatric/Behavioral:  Negative for hallucinations and suicidal ideas.   ? ?  ? ? ?Objective   ? ?BP 108/64   Pulse 87   Temp 97.8 ?F (36.6 ?C)   Resp 12   Ht 5\' 7"  (1.702 m)   Wt 157 lb (71.2 kg)   LMP 01/01/2022 Comment: LMP 4/6-4/06/2022  SpO2 98%   BMI 24.59 kg/m?  ? ?Physical Exam ?Vitals and nursing note reviewed.  ?Constitutional:   ?   Appearance: Normal appearance.  ?HENT:  ?   Right Ear: Tympanic membrane, ear canal and external ear normal.  ?   Left Ear: Tympanic membrane, ear canal and external ear normal.  ?   Mouth/Throat:  ?   Mouth: Mucous membranes are moist.  ?   Pharynx: Oropharynx is clear.  ?Eyes:  ?   Extraocular Movements: Extraocular movements intact.  ?   Pupils: Pupils are equal, round, and reactive to light.  ?Cardiovascular:  ?   Rate and Rhythm: Normal rate and regular rhythm.  ?   Pulses: Normal pulses.  ?   Heart sounds: Normal heart sounds.  ?Pulmonary:  ?   Effort: Pulmonary effort is normal.  ?   Breath sounds: Normal breath sounds.  ?Abdominal:  ?   General: Bowel sounds are normal. There is no distension.  ?   Palpations: There is no mass.  ?   Tenderness: There is no abdominal tenderness.  ?   Hernia: No hernia is present.  ?Musculoskeletal:  ?   Right lower leg: No edema.  ?   Left lower leg: No edema.  ?Lymphadenopathy:  ?   Cervical: No cervical adenopathy.  ?Skin: ?   General: Skin is warm.  ?Neurological:  ?   General: No focal deficit present.  ?   Mental Status: She is alert.  ?   Deep Tendon Reflexes:  ?   Reflex Scores: ?     Bicep reflexes are 2+ on the right side and 2+ on the left side. ?     Patellar reflexes are 2+ on the right side and 2+ on the left side. ?    Comments: Bilateral upper and lower extremity strength 5/5  ?Psychiatric:     ?   Mood and Affect: Mood normal.     ?   Behavior: Behavior normal.     ?   Thought Content: Thought content normal.     ?   Judgment: Judgment  normal.  ? ? ? ?  ? ?Assessment & Plan:  ? ?Problem List Items Addressed This Visit   ? ?  ? Other  ? Preventative health care - Primary  ?  Discussed age-appropriate immunizations and screening exams.  Patient to continue healthy lifestyle ? ?  ?  ? Relevant Orders  ? CBC  ? Comprehensive metabolic panel  ? Lipid panel  ? TSH  ? ? ?Return in about 1 year (around 01/27/2023) for CPE and labs.  ? ?Audria Nine, NP ? ? ?

## 2022-01-27 LAB — COMPREHENSIVE METABOLIC PANEL
ALT: 16 U/L (ref 0–35)
AST: 17 U/L (ref 0–37)
Albumin: 4.4 g/dL (ref 3.5–5.2)
Alkaline Phosphatase: 31 U/L — ABNORMAL LOW (ref 39–117)
BUN: 17 mg/dL (ref 6–23)
CO2: 28 mEq/L (ref 19–32)
Calcium: 9.3 mg/dL (ref 8.4–10.5)
Chloride: 103 mEq/L (ref 96–112)
Creatinine, Ser: 1.19 mg/dL (ref 0.40–1.20)
GFR: 64.4 mL/min (ref >60.00)
Glucose, Bld: 106 mg/dL — ABNORMAL HIGH (ref 70–99)
Potassium: 3.9 mEq/L (ref 3.5–5.1)
Sodium: 139 mEq/L (ref 135–145)
Total Bilirubin: 1 mg/dL (ref 0.2–1.2)
Total Protein: 6.8 g/dL (ref 6.0–8.3)

## 2022-01-27 LAB — LIPID PANEL
Cholesterol: 123 mg/dL (ref 0–200)
HDL: 55.1 mg/dL (ref >39.00)
LDL Cholesterol: 59 mg/dL (ref 0–99)
NonHDL: 67.62
Total CHOL/HDL Ratio: 2
Triglycerides: 41 mg/dL (ref 0.0–149.0)
VLDL: 8.2 mg/dL (ref 0.0–40.0)

## 2022-01-27 LAB — CBC
HCT: 38 % (ref 36.0–46.0)
Hemoglobin: 12.6 g/dL (ref 12.0–15.0)
MCHC: 33.3 g/dL (ref 30.0–36.0)
MCV: 90.5 fl (ref 78.0–100.0)
Platelets: 165 10*3/uL (ref 150.0–400.0)
RBC: 4.2 Mil/uL (ref 3.87–5.11)
RDW: 13.3 % (ref 11.5–15.5)
WBC: 8.3 10*3/uL (ref 4.0–10.5)

## 2022-01-27 LAB — TSH: TSH: 0.44 u[IU]/mL (ref 0.35–5.50)

## 2022-02-18 ENCOUNTER — Telehealth: Payer: Self-pay | Admitting: Podiatry

## 2022-02-18 NOTE — Telephone Encounter (Signed)
Pts right foot has the spot coming back(wart) slowly she still has medication that you had given her should she continue using it.   She is scheduled to follow up on 6.5.2023 about the ingrown toenail removal as well.

## 2022-02-18 NOTE — Telephone Encounter (Signed)
She should continue wart meds at least until follow up

## 2022-02-18 NOTE — Telephone Encounter (Signed)
Called pt and her voicemail is full

## 2022-02-24 DIAGNOSIS — L03316 Cellulitis of umbilicus: Secondary | ICD-10-CM | POA: Diagnosis not present

## 2022-02-26 NOTE — Telephone Encounter (Signed)
Called patient , no answer, could not leave voicemail,mailbox full.

## 2022-03-02 ENCOUNTER — Ambulatory Visit: Payer: Federal, State, Local not specified - PPO | Admitting: Podiatry

## 2022-03-02 ENCOUNTER — Encounter: Payer: Self-pay | Admitting: Podiatry

## 2022-03-02 DIAGNOSIS — B070 Plantar wart: Secondary | ICD-10-CM | POA: Diagnosis not present

## 2022-03-02 DIAGNOSIS — L603 Nail dystrophy: Secondary | ICD-10-CM

## 2022-03-02 NOTE — Progress Notes (Signed)
She presents today concerned that her hallux bilaterally her nails are splitting and she states that her low callused area is starting to come back on the bottom of the foot.  Objective: Vital signs are stable alert and oriented x3.  Pulses are palpable.  The hallux nails bilaterally were chemical matricectomy was performed to the tibial border are growing out about halfway and they have a little split in them.  I trimmed those back for her today and there is no bleeding no purulence no malodor she does have a small little porokeratotic lesion plantar aspect of the foot does not appear to be verrucoid in nature.  Assessment: Benign skin lesion.  Benign nail dystrophy.  Plan: Debrided the nails back for her.  Debrided the porokeratotic lesion.  Placed Cantharone under occlusion to be washed off thoroughly tomorrow she will continue with the Efudex cream follow-up with me as needed

## 2022-04-27 NOTE — Progress Notes (Unsigned)
[facility] [address] [place] [address] Phone: [phone] Subjective:   Bruce Donath, am serving as a scribe for Dr. Antoine Primas.  I'm seeing this patient by the request  of:  Eden Emms, NP  CC: Back pain follow-up  IRC:[id number]  Kerri Moss is a 26 y.o. female coming in with complaint of back pain.  Last seen in our clinic in September 2021 by Dr. Denyse Amass and was initially seen in physical therapy 1 time.  Patient does ride horses and had a significant fall causing low back pain and sacral pain in September 2021.  Patient was encouraged to do physical therapy, get a TENS unit and given a muscle relaxer at that time.  Patient states that she had injury 2 years ago and pain has persisted. Pain when she wakes up, pain with sitting or standing for prolonged period. Pain does not radiate. Hips will hurt after sitting for a while in car. Treats pain with massage therapy, topical analgesics and patches, and heat. Extension seems to be most bothersome.   Patient did have x-rays at that time.  X-rays were independently visualized by me showing no significant bony abnormality of the lumbar or sacral area    Past Medical History:  Diagnosis Date   Car sickness    Chlamydia 06/2021   Environmental allergies    Past Surgical History:  Procedure Laterality Date   DENTAL SURGERY     TONSILLECTOMY AND ADENOIDECTOMY N/A 08/06/2017   Procedure: TONSILLECTOMY AND POSSIBLE ADENOIDECTOMY;  Surgeon: Linus Salmons, MD;  Location: [facility];  Service: ENT;  Laterality: N/A;   Social History   Socioeconomic History   Marital status: Single    Spouse name: Not on file   Number of children: 0   Years of education: Not on file   Highest education level: High school graduate  Occupational History   Not on file  Tobacco Use   Smoking status: Never   Smokeless tobacco: Never  Vaping Use   Vaping Use: Never used  Substance and Sexual  Activity   Alcohol use: Yes    Comment: every other week . less than 6 at a time   Drug use: Never   Sexual activity: Yes    Partners: Male    Birth control/protection: Condom    Comment: Condoms sometimes  Other Topics Concern   Not on file  Social History Narrative   Fulltime: Psychologist, occupational with horsing   Social Determinants of Health   Financial Resource Strain: Not on file  Food Insecurity: Not on file  Transportation Needs: Not on file  Physical Activity: Not on file  Stress: Not on file  Social Connections: Not on file   Allergies  Allergen Reactions   Other Shortness Of Breath and Other (See Comments)    Seasonal allergies- Neb treatments when younger (shots, too)  Severe environmental allergies   Family History  Problem Relation Age of Onset   Anxiety disorder Mother    Obesity Mother    Retinal detachment Mother    Obesity Father    Diabetes Father    Heart disease Father    Anxiety disorder Sister    Obesity Sister    Obesity Maternal Aunt    Retinal detachment Maternal Aunt    Stroke Maternal Grandmother    Seizures Maternal Grandmother    Heart disease Maternal Grandfather    Other Paternal Grandmother        pre diabetic  Diabetes Paternal Grandfather    Heart disease Paternal Grandfather       Current Outpatient Medications (Respiratory):    cetirizine (ZYRTEC) 10 MG tablet, Take 10 mg by mouth daily as needed for allergies or rhinitis.    fluticasone (FLONASE) 50 MCG/ACT nasal spray, Place 2 sprays into both nostrils daily.      Reviewed prior external information including notes and imaging from  primary care provider As well as notes that were available from care everywhere and other healthcare systems. Past medical history significant for subdural hematoma as well as a temporal bone fracture in July 2021  Past medical history, social, surgical and family history all reviewed in electronic medical record.  No pertanent information unless  stated regarding to the chief complaint.   Review of Systems:  No headache, visual changes, nausea, vomiting, diarrhea, constipation, dizziness, abdominal pain, skin rash, fevers, chills, night sweats, weight loss, swollen lymph nodes, body aches, joint swelling, chest pain, shortness of breath, mood changes. POSITIVE muscle aches  Objective  Blood pressure 106/66, pulse (!) 50, height 5\' 7"  (1.702 m), weight 160 lb (72.6 kg), SpO2 98 %.   General: No apparent distress alert and oriented x3 mood and affect normal, dressed appropriately.  HEENT: Pupils equal, extraocular movements intact  Respiratory: Patient's speak in full sentences and does not appear short of breath  Cardiovascular: No lower extremity edema, non tender, no erythema  Patient's neck exam does have some loss of lordosis.  Patient does have more of a loss of lordosis of the lumbar spine.  Tightness of the hip flexors bilaterally.  Significant tightness of the hamstrings bilaterally right greater than left.  Mild tightness noted with FABER.   Osteopathic findings C2 flexed rotated and side bent right C4 flexed rotated and side bent left C7 flexed rotated and side bent left T3 extended rotated and side bent right inhaled third rib T8 extended rotated and side bent left L2 flexed rotated and side bent right Sacrum right on right    Impression and Recommendations:    The above documentation has been reviewed and is accurate and complete , DO

## 2022-04-28 ENCOUNTER — Ambulatory Visit: Payer: Federal, State, Local not specified - PPO | Admitting: Family Medicine

## 2022-04-28 ENCOUNTER — Encounter: Payer: Self-pay | Admitting: Family Medicine

## 2022-04-28 VITALS — BP 106/66 | HR 50 | Ht 67.0 in | Wt 160.0 lb

## 2022-04-28 DIAGNOSIS — M9908 Segmental and somatic dysfunction of rib cage: Secondary | ICD-10-CM

## 2022-04-28 DIAGNOSIS — M9904 Segmental and somatic dysfunction of sacral region: Secondary | ICD-10-CM | POA: Diagnosis not present

## 2022-04-28 DIAGNOSIS — M9903 Segmental and somatic dysfunction of lumbar region: Secondary | ICD-10-CM | POA: Diagnosis not present

## 2022-04-28 DIAGNOSIS — G8929 Other chronic pain: Secondary | ICD-10-CM | POA: Diagnosis not present

## 2022-04-28 DIAGNOSIS — M9901 Segmental and somatic dysfunction of cervical region: Secondary | ICD-10-CM | POA: Diagnosis not present

## 2022-04-28 DIAGNOSIS — M9902 Segmental and somatic dysfunction of thoracic region: Secondary | ICD-10-CM

## 2022-04-28 DIAGNOSIS — M545 Low back pain, unspecified: Secondary | ICD-10-CM | POA: Diagnosis not present

## 2022-04-28 NOTE — Assessment & Plan Note (Signed)
Low back pain.  Patient has had some tenderness to palpation.  Patient states that it is more of a dull, throbbing aching pain.  Does seem to be more muscular. X-rays previously were unremarkable.  Patient given home exercises to work on hip abductor strengthening.  Discussed icing regimen and home exercises.  Follow-up again with me in 6 to 8 weeks.  Worsening pain can consider injections such as trigger points as well as medications such as gabapentin.  Hopefully responded well to osteopathic manipulation.

## 2022-04-28 NOTE — Patient Instructions (Signed)
Do prescribed exercises at least 3x a week Vit D 2000iu daily Rotate mattress

## 2022-04-28 NOTE — Assessment & Plan Note (Signed)

## 2022-06-11 ENCOUNTER — Ambulatory Visit: Payer: Federal, State, Local not specified - PPO | Admitting: Family Medicine

## 2022-06-18 NOTE — Progress Notes (Signed)
[place] Phone: [phone] Subjective:   Kerri Moss, am serving as a scribe for Dr. Hulan Saas.  I'm seeing this patient by the request  of:  Michela Pitcher, NP  CC: Low back pain follow-up  IRS:[id number]  Kerri Moss is a 26 y.o. female coming in with complaint of back pain. OMT 04/28/2022. Patient states that her lower back pain has been intermittent. HEP was helpful.  Patient states that unfortunately she does feel like she is never without some pain.  States it has been going on 3 minutes.  Patient states that it still affects daily activity and still cannot lay flat Increasing in discomfort.  Medications patient has been prescribed: None  Taking:         Reviewed prior external information including notes and imaging from previsou exam, outside providers and external EMR if available.   As well as notes that were available from care everywhere and other healthcare systems.  Past medical history, social, surgical and family history all reviewed in electronic medical record.  Moss pertanent information unless stated regarding to the chief complaint.   Past Medical History:  Diagnosis Date   Car sickness    Chlamydia 06/2021   Environmental allergies     Allergies  Allergen Reactions   Other Shortness Of Breath and Other (See Comments)    Seasonal allergies- Neb treatments when younger (shots, too)  Severe environmental allergies     Review of Systems:  Moss headache, visual changes, nausea, vomiting, diarrhea, constipation, dizziness, abdominal pain, skin rash, fevers, chills, night sweats, weight loss, swollen lymph nodes,  joint swelling, chest pain, shortness of breath, mood changes. POSITIVE muscle aches, body aches  Objective  Blood pressure 94/62, pulse (!) 48, height 5\' 7"  (1.702 m), weight 168 lb (76.2 kg), SpO2 97 %.   General: Moss apparent distress alert and oriented x3 mood  and affect normal, dressed appropriately.  HEENT: Pupils equal, extraocular movements intact  Respiratory: Patient's speak in full sentences and does not appear short of breath  Cardiovascular: Moss lower extremity edema, non tender, Moss erythema  MSK:  Back does have some loss lordosis.  Patient does have some tenderness in the joints bilaterally.  Seems to be fairly severe overall.  Worsening pain with extension.  Osteopathic findings  C2 flexed rotated and side bent right C6 flexed rotated and side bent left T3 extended rotated and side bent right inhaled rib T8 extended rotated and side bent left L2 flexed rotated and side bent right L3 flexed rotated and side bent left L5 flexed rotated and side bent left Sacrum right on right       Assessment and Plan:  Low back pain Patient is having back pain that is out of proportion.  States that she is unable to lay down for a longer extended period of time.  Patient states that this has not made any significant improvement over the course of the last 2 years and if anything may be worsening.  Does get temporary improvement with osteopathic manipulation and wonders to try that.  Sometimes does wake her up at night as well.  Sometimes can have radicular symptoms as well.  At this point we will get laboratory work-up to see if anything such as a underlying autoimmune disease that could be potentially causing ankylosing spondylitis as well as advanced imaging with MRI of the lumbar spine will be ordered today.  Depending on  findings this can change medical management.  Otherwise we will consider other medications such as Cymbalta or Effexor to help with patient's more chronic discomfort and pain.  Follow-up with me again in 4 to 6 weeks otherwise.    Nonallopathic problems  Decision today to treat with OMT was based on Physical Exam  After verbal consent patient was treated with HVLA, ME, FPR techniques in cervical, rib, thoracic, lumbar, and  sacral  areas  Patient tolerated the procedure well with improvement in symptoms  Patient given exercises, stretches and lifestyle modifications  See medications in patient instructions if given  Patient will follow up in 4-8 weeks    The above documentation has been reviewed and is accurate and complete Judi Saa, DO          Note: This dictation was prepared with Dragon dictation along with smaller phrase technology. Any transcriptional errors that result from this process are unintentional.

## 2022-06-19 ENCOUNTER — Ambulatory Visit (INDEPENDENT_AMBULATORY_CARE_PROVIDER_SITE_OTHER): Payer: Federal, State, Local not specified - PPO

## 2022-06-19 ENCOUNTER — Encounter: Payer: Self-pay | Admitting: Family Medicine

## 2022-06-19 ENCOUNTER — Ambulatory Visit: Payer: Federal, State, Local not specified - PPO | Admitting: Family Medicine

## 2022-06-19 VITALS — BP 94/62 | HR 48 | Ht 67.0 in | Wt 168.0 lb

## 2022-06-19 DIAGNOSIS — M9903 Segmental and somatic dysfunction of lumbar region: Secondary | ICD-10-CM

## 2022-06-19 DIAGNOSIS — M9901 Segmental and somatic dysfunction of cervical region: Secondary | ICD-10-CM

## 2022-06-19 DIAGNOSIS — M130 Polyarthritis, unspecified: Secondary | ICD-10-CM

## 2022-06-19 DIAGNOSIS — M9902 Segmental and somatic dysfunction of thoracic region: Secondary | ICD-10-CM

## 2022-06-19 DIAGNOSIS — M9904 Segmental and somatic dysfunction of sacral region: Secondary | ICD-10-CM

## 2022-06-19 DIAGNOSIS — G8929 Other chronic pain: Secondary | ICD-10-CM

## 2022-06-19 DIAGNOSIS — M9908 Segmental and somatic dysfunction of rib cage: Secondary | ICD-10-CM | POA: Diagnosis not present

## 2022-06-19 DIAGNOSIS — M2550 Pain in unspecified joint: Secondary | ICD-10-CM

## 2022-06-19 DIAGNOSIS — M545 Low back pain, unspecified: Secondary | ICD-10-CM

## 2022-06-19 LAB — IBC PANEL
Iron: 41 ug/dL — ABNORMAL LOW (ref 42–145)
Saturation Ratios: 10.1 % — ABNORMAL LOW (ref 20.0–50.0)
TIBC: 404.6 ug/dL (ref 250.0–450.0)
Transferrin: 289 mg/dL (ref 212.0–360.0)

## 2022-06-19 LAB — COMPREHENSIVE METABOLIC PANEL
ALT: 12 U/L (ref 0–35)
AST: 14 U/L (ref 0–37)
Albumin: 4.2 g/dL (ref 3.5–5.2)
Alkaline Phosphatase: 33 U/L — ABNORMAL LOW (ref 39–117)
BUN: 16 mg/dL (ref 6–23)
CO2: 28 mEq/L (ref 19–32)
Calcium: 9.1 mg/dL (ref 8.4–10.5)
Chloride: 104 mEq/L (ref 96–112)
Creatinine, Ser: 0.94 mg/dL (ref 0.40–1.20)
GFR: 85.23 mL/min (ref >60.00)
Glucose, Bld: 92 mg/dL (ref 70–99)
Potassium: 4.2 mEq/L (ref 3.5–5.1)
Sodium: 139 mEq/L (ref 135–145)
Total Bilirubin: 0.3 mg/dL (ref 0.2–1.2)
Total Protein: 7 g/dL (ref 6.0–8.3)

## 2022-06-19 LAB — CBC WITH DIFFERENTIAL/PLATELET
Basophils Absolute: 0.1 10*3/uL (ref 0.0–0.1)
Basophils Relative: 1 % (ref 0.0–3.0)
Eosinophils Absolute: 0.9 10*3/uL — ABNORMAL HIGH (ref 0.0–0.7)
Eosinophils Relative: 11.6 % — ABNORMAL HIGH (ref 0.0–5.0)
HCT: 37.7 % (ref 36.0–46.0)
Hemoglobin: 12.4 g/dL (ref 12.0–15.0)
Lymphocytes Relative: 26.1 % (ref 12.0–46.0)
Lymphs Abs: 2.1 10*3/uL (ref 0.7–4.0)
MCHC: 32.8 g/dL (ref 30.0–36.0)
MCV: 90 fl (ref 78.0–100.0)
Monocytes Absolute: 0.4 10*3/uL (ref 0.1–1.0)
Monocytes Relative: 5.4 % (ref 3.0–12.0)
Neutro Abs: 4.5 10*3/uL (ref 1.4–7.7)
Neutrophils Relative %: 55.9 % (ref 43.0–77.0)
Platelets: 151 10*3/uL (ref 150.0–400.0)
RBC: 4.19 Mil/uL (ref 3.87–5.11)
RDW: 14.4 % (ref 11.5–15.5)
WBC: 8.1 10*3/uL (ref 4.0–10.5)

## 2022-06-19 LAB — TSH: TSH: 0.43 u[IU]/mL (ref 0.35–5.50)

## 2022-06-19 LAB — SEDIMENTATION RATE: Sed Rate: 2 mm/hr (ref 0–20)

## 2022-06-19 LAB — VITAMIN D 25 HYDROXY (VIT D DEFICIENCY, FRACTURES): VITD: 37.51 ng/mL (ref 30.00–100.00)

## 2022-06-19 LAB — URIC ACID: Uric Acid, Serum: 3.8 mg/dL (ref 2.4–7.0)

## 2022-06-19 LAB — FERRITIN: Ferritin: 11.5 ng/mL (ref 10.0–291.0)

## 2022-06-19 LAB — C-REACTIVE PROTEIN: CRP: <1 mg/dL (ref 0.5–20.0)

## 2022-06-19 NOTE — Assessment & Plan Note (Signed)
Patient is having back pain that is out of proportion.  States that she is unable to lay down for a longer extended period of time.  Patient states that this has not made any significant improvement over the course of the last 2 years and if anything may be worsening.  Does get temporary improvement with osteopathic manipulation and wonders to try that.  Sometimes does wake her up at night as well.  Sometimes can have radicular symptoms as well.  At this point we will get laboratory work-up to see if anything such as a underlying autoimmune disease that could be potentially causing ankylosing spondylitis as well as advanced imaging with MRI of the lumbar spine will be ordered today.  Depending on findings this can change medical management.  Otherwise we will consider other medications such as Cymbalta or Effexor to help with patient's more chronic discomfort and pain.  Follow-up with me again in 4 to 6 weeks otherwise.

## 2022-06-19 NOTE — Patient Instructions (Signed)
Great to see you as always  We will get xrays today  Labs today  If not better in 10 days write [place] and lets consider MRI  Otherwise see me again in 5 weeks

## 2022-06-22 LAB — HLA-B27 ANTIGEN: HLA-B27 Antigen: NEGATIVE

## 2022-06-22 LAB — ANGIOTENSIN CONVERTING ENZYME: Angiotensin-Converting Enzyme: 46 U/L (ref 9–67)

## 2022-06-22 LAB — CALCIUM, IONIZED: Calcium, Ion: 4.9 mg/dL (ref 4.7–5.5)

## 2022-06-22 LAB — RHEUMATOID FACTOR: Rheumatoid fact SerPl-aCnc: <14 IU/mL (ref <14)

## 2022-06-22 LAB — CYCLIC CITRUL PEPTIDE ANTIBODY, IGG: Cyclic Citrullin Peptide Ab: <16 UNITS

## 2022-06-22 LAB — PTH, INTACT AND CALCIUM
Calcium: 9.2 mg/dL (ref 8.6–10.2)
PTH: 9 pg/mL — ABNORMAL LOW (ref 16–77)

## 2022-06-22 LAB — ANA: Anti Nuclear Antibody (ANA): NEGATIVE

## 2022-06-25 ENCOUNTER — Telehealth: Payer: Self-pay | Admitting: Family Medicine

## 2022-06-25 NOTE — Telephone Encounter (Signed)
Pt called. She does not feel any better, but seems to have felt this way for awhile.  Wondering if the labs/xray results give any explanation. Sounds like we were considering a MRI, but she feels unsure if that is necessary.

## 2022-06-30 ENCOUNTER — Other Ambulatory Visit: Payer: Self-pay

## 2022-06-30 DIAGNOSIS — M545 Low back pain, unspecified: Secondary | ICD-10-CM

## 2022-06-30 NOTE — Telephone Encounter (Signed)
Spoke with patient and she would like to proceed with MRI. Order placed. Patient will call to schedule.

## 2022-07-09 ENCOUNTER — Ambulatory Visit
Admission: RE | Admit: 2022-07-09 | Discharge: 2022-07-09 | Disposition: A | Discharge status: Home / Self Care | Payer: Federal, State, Local not specified - PPO | Source: Ambulatory Visit | Attending: Family Medicine | Admitting: Family Medicine

## 2022-07-09 DIAGNOSIS — M545 Low back pain, unspecified: Secondary | ICD-10-CM | POA: Diagnosis not present

## 2022-07-15 ENCOUNTER — Telehealth: Payer: Self-pay

## 2022-07-15 NOTE — Telephone Encounter (Signed)
Called pt to offer sooner appt. No vm set up

## 2022-07-16 NOTE — Progress Notes (Signed)
[facility] [address] [place] Phone: [phone] Subjective:   Kerri Moss, am serving as a scribe for Dr. Hulan Saas. I'm seeing this patient by the request  of:  Michela Pitcher, NP  CC: Back and neck pain follow-up  VVO:[id number]  Kerri Moss is a 26 y.o. female coming in with complaint of back and neck pain. OMT 06/19/2022. Patient states that she is here to go over her MRI results.  Patient continues to have the same pain.  Medications patient has been prescribed: None  Taking:         Reviewed prior external information including notes and imaging from previsou exam, outside providers and external EMR if available.   As well as notes that were available from care everywhere and other healthcare systems.  Past medical history, social, surgical and family history all reviewed in electronic medical record.  No pertanent information unless stated regarding to the chief complaint.   Past Medical History:  Diagnosis Date   Car sickness    Chlamydia 06/2021   Environmental allergies     Allergies  Allergen Reactions   Other Shortness Of Breath and Other (See Comments)    Seasonal allergies- Neb treatments when younger (shots, too)  Severe environmental allergies     Review of Systems:  No headache, visual changes, nausea, vomiting, diarrhea, constipation, dizziness, abdominal pain, skin rash, fevers, chills, night sweats, weight loss, swollen lymph nodes, body aches, joint swelling, chest pain, shortness of breath, mood changes. POSITIVE muscle aches  Objective  Blood pressure 120/78, pulse (!) 49, height 5\' 7"  (1.702 m), weight 163 lb (73.9 kg), last menstrual period 06/18/2022, SpO2 97 %.   General: No apparent distress alert and oriented x3 mood and affect normal, dressed appropriately.  HEENT: Pupils equal, extraocular movements intact  Respiratory: Patient's speak in full sentences and does not  appear short of breath  Cardiovascular: No lower extremity edema, non tender, no erythema  MSK:  Back does have some mild loss of lordosis.  Patient does seem mildly uncomfortable with sitting.  Worsening pain with extension of the back        Assessment and Plan:  No problem-specific Assessment & Plan notes found for this encounter.    Nonallopathic problems  Decision today to treat with OMT was based on Physical Exam  After verbal consent patient was treated with HVLA, ME, FPR techniques in cervical, rib, thoracic, lumbar, and sacral  areas  Patient tolerated the procedure well with improvement in symptoms  Patient given exercises, stretches and lifestyle modifications  See medications in patient instructions if given  Patient will follow up in 4-8 weeks    The above documentation has been reviewed and is accurate and complete Lyndal Pulley, DO          Note: This dictation was prepared with Dragon dictation along with smaller phrase technology. Any transcriptional errors that result from this process are unintentional.

## 2022-07-17 ENCOUNTER — Ambulatory Visit: Payer: Federal, State, Local not specified - PPO | Admitting: Family Medicine

## 2022-07-17 VITALS — BP 120/78 | HR 49 | Ht 67.0 in | Wt 163.0 lb

## 2022-07-17 DIAGNOSIS — M545 Low back pain, unspecified: Secondary | ICD-10-CM

## 2022-07-17 DIAGNOSIS — M5416 Radiculopathy, lumbar region: Secondary | ICD-10-CM | POA: Diagnosis not present

## 2022-07-17 DIAGNOSIS — G8929 Other chronic pain: Secondary | ICD-10-CM

## 2022-07-17 NOTE — Assessment & Plan Note (Signed)
Patient's low back does have some loss of lordosis.  Some tenderness to palpation in the paraspinal musculature.  Patient does have some radicular symptoms and pain that is out of proportion.  The patient does have the degenerative disc disease in the facet arthropathy.  Patient is MRI we did go over at great length today.  We discussed the possibility of the nerve root impingement.  We discussed with patient having mostly localized pain that is stopping her from all daily activities and can wake her up at night that I do think the next step for a epidural would be beneficial.  Patient also given the suggestions of potentially repeating formal physical therapy, other medications, and patient declinedto try the epidural.  Patient will come back 4 weeks after the epidural to see how she is responding.  Total time reviewing the MRI and discussed with patient 31 minutes

## 2022-07-17 NOTE — Patient Instructions (Signed)
Epi L5/S1 [id number] See me 5 weeks after injection to see how you are doing

## 2022-07-23 ENCOUNTER — Ambulatory Visit: Payer: Federal, State, Local not specified - PPO | Admitting: Family Medicine

## 2022-07-27 ENCOUNTER — Ambulatory Visit
Admission: RE | Admit: 2022-07-27 | Discharge: 2022-07-27 | Disposition: A | Discharge status: Home / Self Care | Payer: Federal, State, Local not specified - PPO | Source: Ambulatory Visit | Attending: Family Medicine | Admitting: Family Medicine

## 2022-07-27 DIAGNOSIS — M5416 Radiculopathy, lumbar region: Secondary | ICD-10-CM

## 2022-07-27 DIAGNOSIS — M47817 Spondylosis without myelopathy or radiculopathy, lumbosacral region: Secondary | ICD-10-CM | POA: Diagnosis not present

## 2022-07-27 MED ORDER — METHYLPREDNISOLONE ACETATE 40 MG/ML INJ SUSP (RADIOLOG
80.0000 mg | Freq: Once | INTRAMUSCULAR | Status: AC
  Administered 2022-07-27: 80 mg via EPIDURAL

## 2022-07-27 MED ORDER — IOPAMIDOL (ISOVUE-M 200) INJECTION 41%
1.0000 mL | Freq: Once | INTRAMUSCULAR | Status: AC
  Administered 2022-07-27: 1 mL via EPIDURAL

## 2022-07-27 NOTE — Discharge Instructions (Signed)

## 2022-07-30 ENCOUNTER — Other Ambulatory Visit (HOSPITAL_COMMUNITY)
Admission: RE | Admit: 2022-07-30 | Discharge: 2022-07-30 | Disposition: A | Discharge status: Home / Self Care | Payer: Federal, State, Local not specified - PPO | Source: Ambulatory Visit | Attending: Advanced Practice Midwife | Admitting: Advanced Practice Midwife

## 2022-07-30 ENCOUNTER — Encounter: Payer: Self-pay | Admitting: Advanced Practice Midwife

## 2022-07-30 ENCOUNTER — Ambulatory Visit (INDEPENDENT_AMBULATORY_CARE_PROVIDER_SITE_OTHER): Payer: Federal, State, Local not specified - PPO | Admitting: Advanced Practice Midwife

## 2022-07-30 VITALS — BP 114/69 | HR 60 | Ht 68.0 in | Wt 163.0 lb

## 2022-07-30 DIAGNOSIS — Z3009 Encounter for other general counseling and advice on contraception: Secondary | ICD-10-CM | POA: Diagnosis not present

## 2022-07-30 DIAGNOSIS — Z113 Encounter for screening for infections with a predominantly sexual mode of transmission: Secondary | ICD-10-CM | POA: Diagnosis not present

## 2022-07-30 DIAGNOSIS — Z0000 Encounter for general adult medical examination without abnormal findings: Secondary | ICD-10-CM

## 2022-07-30 DIAGNOSIS — Z7251 High risk heterosexual behavior: Secondary | ICD-10-CM

## 2022-07-30 NOTE — Progress Notes (Signed)
Annual   LMP: 07/16/22 Monthly with Moderate flow. Last pap: 07/09/2021 NO Hx of Abnormal pap.  Contraception: None / Undecided if she wants to start.  STD Screening: Desires   Wants to discuss contraception Body wash Vaginal Irritation

## 2022-07-30 NOTE — Patient Instructions (Signed)
[email or web address]

## 2022-07-31 DIAGNOSIS — Z7251 High risk heterosexual behavior: Secondary | ICD-10-CM | POA: Insufficient documentation

## 2022-07-31 LAB — RPR: RPR Ser Ql: NONREACTIVE

## 2022-07-31 LAB — HEPATITIS B SURFACE ANTIGEN: Hepatitis B Surface Ag: NEGATIVE

## 2022-07-31 LAB — HEPATITIS C ANTIBODY: Hep C Virus Ab: NONREACTIVE

## 2022-07-31 LAB — HIV ANTIBODY (ROUTINE TESTING W REFLEX): HIV Screen 4th Generation wRfx: NONREACTIVE

## 2022-07-31 NOTE — Progress Notes (Signed)
GYNECOLOGY PREVENTATIVE CARE ENCOUNTER NOTE  History:     Kerri Moss is a 26 y.o. G0P0 female here for routine STD screening and contraception counseling.  Current complaints: recurrent unprotected sex. Patient's partner is encouraging her to consider initiating contraception.   Denies abnormal vaginal bleeding, discharge, pelvic pain, problems with intercourse or other gynecologic concerns.    Gynecologic History Patient's last menstrual period was 07/16/2022 (approximate). Contraception: none Last Pap: 06/2021. Result was normal with negative HPV  Obstetric History OB History  No obstetric history on file.    Past Medical History:  Diagnosis Date   Car sickness    Chlamydia 06/2021   Environmental allergies     Past Surgical History:  Procedure Laterality Date   DENTAL SURGERY     TONSILLECTOMY AND ADENOIDECTOMY N/A 08/06/2017   Procedure: TONSILLECTOMY AND POSSIBLE ADENOIDECTOMY;  Surgeon: Beverly Gust, MD;  Location: [facility];  Service: ENT;  Laterality: N/A;    Current Outpatient Medications on File Prior to Visit  Medication Sig Dispense Refill   cetirizine (ZYRTEC) 10 MG tablet Take 10 mg by mouth daily as needed for allergies or rhinitis.      fluticasone (FLONASE) 50 MCG/ACT nasal spray Place 2 sprays into both nostrils daily.     No current facility-administered medications on file prior to visit.    Allergies  Allergen Reactions   Other Shortness Of Breath and Other (See Comments)    Seasonal allergies- Neb treatments when younger (shots, too)  Severe environmental allergies    Social History:  reports that she has never smoked. She has never used smokeless tobacco. She reports current alcohol use. She reports that she does not use drugs.  Family History  Problem Relation Age of Onset   Anxiety disorder Mother    Obesity Mother    Retinal detachment Mother    Obesity Father    Diabetes Father    Heart disease Father    Anxiety  disorder Sister    Obesity Sister    Obesity Maternal Aunt    Retinal detachment Maternal Aunt    Stroke Maternal Grandmother    Seizures Maternal Grandmother    Heart disease Maternal Grandfather    Other Paternal Grandmother        pre diabetic   Diabetes Paternal Grandfather    Heart disease Paternal Grandfather     The following portions of the patient's history were reviewed and updated as appropriate: allergies, current medications, past family history, past medical history, past social history, past surgical history and problem list.  Review of Systems Pertinent items noted in HPI and remainder of comprehensive ROS otherwise negative.  Physical Exam:  BP 114/69   Pulse 60   Ht 5\' 8"  (1.727 m)   Wt 163 lb (73.9 kg)   LMP 07/16/2022 (Approximate)   BMI 24.78 kg/m  CONSTITUTIONAL: Well-developed, well-nourished female in no acute distress.  EYES: Conjunctivae and EOM are normal. Pupils are equal, round, and reactive to light. No scleral icterus.  SKIN: Skin is warm and dry. No rash noted. Not diaphoretic. No erythema. No pallor. MUSCULOSKELETAL: Normal range of motion.  NEUROLOGIC: Alert and oriented to person, place, and time.  PSYCHIATRIC: Normal mood and affect. Normal behavior. Normal judgment and thought content. CARDIOVASCULAR: Normal heart rate noted RESPIRATORY: Normal WOB    Assessment and Plan:    1. Well woman exam without gynecological exam - Scheduled as annual - Patient does not desire physical exam, requests STD screening and  contraception counseling  2. Unprotected sex - Strong concern for risk of unplanned pregnancy  3. Screening for STD (sexually transmitted disease)  - Cervicovaginal ancillary only( [place]) - Hepatitis C antibody - Hepatitis B surface antigen - RPR - HIV Antibody (routine testing w rflx)  4. Encounter for counseling regarding contraception - Reviewed all forms of birth control options available including hormonal  contraceptive medication including pill, patch, ring, injection,contraceptive implant; hormonal and nonhormonal IUDs; abstinence and permanent sterilization options not reviewed. Risks and benefits reviewed.  Questions were answered.  Information was given to patient to review.  - [email or web address] used for summary of risks and benefits  Patient will review contraception options and schedule placement or call for prescription as needed  Routine preventative health maintenance measures emphasized. Please refer to After Visit Summary for other counseling recommendations.     Mallie Snooks, [place], MSN, CNM Certified Nurse Midwife, Product/process development scientist for [employer], [place]

## 2022-08-03 LAB — CERVICOVAGINAL ANCILLARY ONLY
Bacterial Vaginitis (gardnerella): NEGATIVE
Candida Glabrata: NEGATIVE
Candida Vaginitis: POSITIVE — AB
Chlamydia: NEGATIVE
Comment: NEGATIVE
Comment: NEGATIVE
Comment: NEGATIVE
Comment: NEGATIVE
Comment: NEGATIVE
Comment: NORMAL
Neisseria Gonorrhea: NEGATIVE
Trichomonas: NEGATIVE

## 2022-08-05 ENCOUNTER — Telehealth: Payer: Self-pay | Admitting: *Deleted

## 2022-08-05 ENCOUNTER — Telehealth: Payer: Self-pay

## 2022-08-05 MED ORDER — FLUCONAZOLE 150 MG PO TABS: 150.0000 mg | ORAL_TABLET | Freq: Once | ORAL | 1 refills | Status: AC

## 2022-08-05 NOTE — Telephone Encounter (Signed)
Returned pt call from the answering service. Pt vm was full.

## 2022-08-05 NOTE — Telephone Encounter (Signed)
Returned pts call and informed her of swab results and rx to be sent in to pharmacy

## 2022-08-24 ENCOUNTER — Telehealth: Payer: Self-pay

## 2022-08-24 ENCOUNTER — Ambulatory Visit (INDEPENDENT_AMBULATORY_CARE_PROVIDER_SITE_OTHER)
Admission: RE | Admit: 2022-08-24 | Discharge: 2022-08-24 | Disposition: A | Discharge status: Home / Self Care | Payer: Federal, State, Local not specified - PPO | Source: Ambulatory Visit | Attending: Nurse Practitioner | Admitting: Nurse Practitioner

## 2022-08-24 ENCOUNTER — Ambulatory Visit: Payer: Federal, State, Local not specified - PPO | Admitting: Nurse Practitioner

## 2022-08-24 VITALS — BP 100/58 | HR 74 | Temp 98.3°F | Resp 14 | Ht 68.0 in | Wt 161.0 lb

## 2022-08-24 DIAGNOSIS — R062 Wheezing: Secondary | ICD-10-CM | POA: Diagnosis not present

## 2022-08-24 DIAGNOSIS — R0689 Other abnormalities of breathing: Secondary | ICD-10-CM | POA: Diagnosis not present

## 2022-08-24 DIAGNOSIS — R0989 Other specified symptoms and signs involving the circulatory and respiratory systems: Secondary | ICD-10-CM | POA: Diagnosis not present

## 2022-08-24 MED ORDER — PREDNISONE 20 MG PO TABS: ORAL_TABLET | ORAL | 0 refills | Status: AC

## 2022-08-24 NOTE — Telephone Encounter (Signed)
Per appt notes pt already has appt scheduled with Audria Nine NP 08/24/22 at 2 PM. Sending note to Audria Nine NP and cable pool.

## 2022-08-24 NOTE — Progress Notes (Signed)
Acute Office Visit  Subjective:     Patient ID: Kerri Moss, female    DOB: [birth year], 26 y.o.   MRN: [id number]  Chief Complaint  Patient presents with   Cough    4 weeks ago had cold/sinus issues and resolved, then about 2 weeks ago had a little sore throat, drainage, sinus congestion, slight cough, then sx were improving. The last 2 days has been blowing clear mucus. Now coughing more and rattling in the chest.      HPI Patient is in today for sick symptoms   Started approx 4 weeks ago that iproved. States then 2 weeks ago she got similar symptoms. States it started out with some sore throat, coughing and blowing nose. States that she felt like a head cold and coughing up stuff in the morning. States that the "cold" is better but she is still hearing a rattling in her lungs and feels like the cough isnon productive now. States that she was taking mucinex, inhaler, been taking allergy medication. Covid test at the beginning that was negative No covid vaccines   States that she feels back to normal blowing nose with clear mucous   Review of Systems  Constitutional:  Negative for chills, fever and malaise/fatigue.       Appetite normal for her  HENT:  Negative for ear discharge, ear pain, sinus pain and sore throat.   Respiratory:  Positive for cough and wheezing. Negative for sputum production and shortness of breath.   Musculoskeletal:  Negative for joint pain and myalgias.  Neurological:  Negative for headaches.        Objective:    BP (!) 100/58   Pulse 74   Temp 98.3 F (36.8 C)   Resp 14   Ht 5\' 8"  (1.727 m)   Wt 161 lb (73 kg)   LMP 08/13/2022 (Approximate)   SpO2 99%   BMI 24.48 kg/m    Physical Exam Vitals and nursing note reviewed.  Constitutional:      Appearance: Normal appearance.  HENT:     Right Ear: Tympanic membrane, ear canal and external ear normal.     Left Ear: Tympanic membrane, ear canal and external ear normal.     Nose:     Right  Sinus: No maxillary sinus tenderness or frontal sinus tenderness.     Left Sinus: No maxillary sinus tenderness or frontal sinus tenderness.     Mouth/Throat:     Mouth: Mucous membranes are moist.     Pharynx: Oropharynx is clear.  Cardiovascular:     Rate and Rhythm: Normal rate and regular rhythm.     Heart sounds: Normal heart sounds.  Pulmonary:     Effort: Pulmonary effort is normal.     Breath sounds: Wheezing and rhonchi present.  Lymphadenopathy:     Cervical: No cervical adenopathy.  Neurological:     Mental Status: She is alert.     No results found for any visits on 08/24/22.      Assessment & Plan:   Problem List Items Addressed This Visit       Other   Wheezing - Primary    Pending chest x-ray prednisone taper.  Prednisone precautions reviewed.  Follow-up if no improvement.  Patient adamant that she is not pregnant last menstrual period 07/16/2022.  Did offer do pregnancy test patient declined      Relevant Medications   predniSONE (DELTASONE) 20 MG tablet   Other Relevant Orders   DG  Chest 2 View   Adventitious breath sounds    Wheezing and rhonchi pending chest x-ray will treat with prednisone taper      Relevant Orders   DG Chest 2 View    Meds ordered this encounter  Medications   predniSONE (DELTASONE) 20 MG tablet    Sig: Take 1 tablet (20 mg total) by mouth 2 (two) times daily with a meal for 3 days, THEN 1 tablet (20 mg total) daily with breakfast for 3 days. Avoid NSAIDS like ibuprofen, motrin, aleve, naproxen, BC/Goody powder.    Dispense:  9 tablet    Refill:  0    Order Specific Question:   Supervising Provider    Answer:   TOWER, MARNE A [[id number]]    Return if symptoms worsen or fail to improve.  Audria Nine, NP

## 2022-08-24 NOTE — Assessment & Plan Note (Signed)
Pending chest x-ray prednisone taper.  Prednisone precautions reviewed.  Follow-up if no improvement.  Patient adamant that she is not pregnant last menstrual period 07/16/2022.  Did offer do pregnancy test patient declined

## 2022-08-24 NOTE — Assessment & Plan Note (Signed)
Wheezing and rhonchi pending chest x-ray will treat with prednisone taper

## 2022-08-24 NOTE — Patient Instructions (Signed)
Nice to see you today I will be in touch with the xray results once I have them Follow up if you do not improve

## 2022-08-24 NOTE — Telephone Encounter (Signed)
Noted. Will evaluate in office  

## 2022-08-24 NOTE — Telephone Encounter (Signed)
[facility] Night - Client TELEPHONE ADVICE RECORD AccessNurse Patient Name: ILYANNA BAILLARGEON Gender: Female DOB: 1996/06/10 Age: 26 Y 2 M 6 D Return Phone Number: [phone] (Primary) Address: [place]/ [place]/ [place]  [address] Client [facility] Night - Client Client Site [facility] - Night Provider Audria Nine- NP Contact Type Call Who Is Calling Patient / Member / Family / Caregiver Call Type Triage / Clinical Relationship To Patient Self Return Phone Number [phone] (Primary) Chief Complaint Cough Reason for Call Symptomatic / Request for Health Information Initial Comment Caller states she wanted to know if she can get an appointment with her primary care due to a cough. Translation No Nurse Assessment Nurse: Juanetta Gosling, RN, [place] Date/Time (Eastern Time): 08/24/2022 8:02:55 AM Confirm and document reason for call. If symptomatic, describe symptoms. ---Caller states she has a cough that started about a week and a half ago. No fever. Does the patient have any new or worsening symptoms? ---Yes Will a triage be completed? ---Yes Related visit to physician within the last 2 weeks? ---No Does the PT have any chronic conditions? (i.e. diabetes, asthma, this includes High risk factors for pregnancy, etc.) ---Yes List chronic conditions. ---Asthma Is the patient pregnant or possibly pregnant? (Ask all females between the ages of 70-55) ---No Is this a behavioral health or substance abuse call? ---No Guidelines Guideline Title Affirmed Question Affirmed Notes Nurse Date/Time Lamount Cohen Time) Cough - Acute Productive Wheezing is present Juanetta Gosling, RN, Quentin Mulling 08/24/2022 8:05:27 AM Disp. Time Lamount Cohen Time) Disposition Final User 08/24/2022 8:09:43 AM See HCP within 4 Hours (or PCP triage) Yes Juanetta Gosling, RN, Quentin Mulling PLEASE NOTE: All timestamps contained within this report are represented as  [place] Standard Time. CONFIDENTIALTY NOTICE: This fax transmission is intended only for the addressee. It contains information that is legally privileged, confidential or otherwise protected from use or disclosure. If you are not the intended recipient, you are strictly prohibited from reviewing, disclosing, copying using or disseminating any of this information or taking any action in reliance on or regarding this information. If you have received this fax in error, please notify us immediately by telephone so that we can arrange for its return to [place]. Phone: [phone], Toll-Free: [phone], Fax: [phone] Page: 2 of 2 Call Id: [id number] Final Disposition 08/24/2022 8:09:43 AM See HCP within 4 Hours (or PCP triage) Yes Juanetta Gosling, RN, Quentin Mulling Caller Disagree/Comply Comply Caller Understands Yes PreDisposition Home Care Care Advice Given Per Guideline SEE HCP (OR PCP TRIAGE) WITHIN 4 HOURS: * IF OFFICE WILL BE OPEN: You need to be seen within the next 3 or 4 hours. Call your doctor (or NP/PA) now or as soon as the office opens. COUGH SYRUP WITH DEXTROMETHORPHAN: * Cough syrups containing the cough suppressant dextromethorphan may help decrease your cough. * Examples: Delsym 12-hour Cough, Robitussin Cough Long-Acting, Triaminic Long-Acting, Vicks DayQuil Cough. CALL BACK IF: * You become worse CARE ADVICE given per Cough - Acute Productive (Adult) guideline. Referrals REFERRED TO PCP OFFIC

## 2022-08-28 NOTE — Progress Notes (Signed)
[facility] [address] [place] [address] Phone: [phone] Subjective:   Kerri Moss, am serving as a scribe for Dr. Antoine Primas.  I'm seeing this patient by the request  of:  Eden Emms, NP  CC: Low back pain follow-up  MWU:[id number]  07/17/2022 Patient's low back does have some loss of lordosis.  Some tenderness to palpation in the paraspinal musculature.  Patient does have some radicular symptoms and pain that is out of proportion.  The patient does have the degenerative disc disease in the facet arthropathy.  Patient is MRI we did go over at great length today.  We discussed the possibility of the nerve root impingement.  We discussed with patient having mostly localized pain that is stopping her from all daily activities and can wake her up at night that I do think the next step for a epidural would be beneficial.  Patient also given the suggestions of potentially repeating formal physical therapy, other medications, and patient declinedto try the epidural.  Patient will come back 4 weeks after the epidural to see how she is responding.  Total time reviewing the MRI and discussed with patient 31 minutes    Update 08/31/2022 Kerri Moss is a 26 y.o. female coming in with complaint of lumbar spine pain.  Patient was continuing to have back pain and did undergo an epidural on July 27, 2022.  Patient states that the epidural has helped. Patient does not have any pinching in her lower spine anymore, does have some tightness in the low back. Tries to do some yoga every other day. Patient states sleeping is when she notices the pain most.      Past Medical History:  Diagnosis Date   Car sickness    Chlamydia 06/2021   Environmental allergies    Past Surgical History:  Procedure Laterality Date   DENTAL SURGERY     TONSILLECTOMY AND ADENOIDECTOMY N/A 08/06/2017   Procedure: TONSILLECTOMY AND POSSIBLE ADENOIDECTOMY;  Surgeon:  Linus Salmons, MD;  [facility];  Service: ENT;  Laterality: N/A;   Social History   Socioeconomic History   Marital status: Single    Spouse name: Not on file   Number of children: 0   Years of education: Not on file   Highest education level: High school graduate  Occupational History   Not on file  Tobacco Use   Smoking status: Never   Smokeless tobacco: Never  Vaping Use   Vaping Use: Never used  Substance and Sexual Activity   Alcohol use: Yes    Comment: every other week . less than 6 at a time   Drug use: Never   Sexual activity: Yes    Partners: Male  Other Topics Concern   Not on file  Social History Narrative   Fulltime: Psychologist, occupational with horsing   Social Determinants of Health   Financial Resource Strain: Not on file  Food Insecurity: Not on file  Transportation Needs: Not on file  Physical Activity: Not on file  Stress: Not on file  Social Connections: Not on file   Allergies  Allergen Reactions   Other Shortness Of Breath and Other (See Comments)    Seasonal allergies- Neb treatments when younger (shots, too)  Severe environmental allergies   Family History  Problem Relation Age of Onset   Anxiety disorder Mother    Obesity Mother    Retinal detachment Mother    Obesity Father  Diabetes Father    Heart disease Father    Anxiety disorder Sister    Obesity Sister    Obesity Maternal Aunt    Retinal detachment Maternal Aunt    Stroke Maternal Grandmother    Seizures Maternal Grandmother    Heart disease Maternal Grandfather    Other Paternal Grandmother        pre diabetic   Diabetes Paternal Grandfather    Heart disease Paternal Grandfather       Current Outpatient Medications (Respiratory):    fluticasone (FLONASE) 50 MCG/ACT nasal spray, Place 2 sprays into both nostrils daily.   loratadine (CLARITIN) 10 MG tablet, Take 10 mg by mouth daily.   cetirizine (ZYRTEC) 10 MG tablet, Take 10 mg by mouth daily as needed for  allergies or rhinitis.  (Patient not taking: Reported on 08/24/2022)    Review of Systems:  No headache, visual changes, nausea, vomiting, diarrhea, constipation, dizziness, abdominal pain, skin rash, fevers, chills, night sweats, weight loss, swollen lymph nodes, body aches, joint swelling, chest pain, shortness of breath, mood changes. POSITIVE muscle aches but improved   Objective  Blood pressure 112/60, pulse 74, height 5\' 8"  (1.727 m), weight 161 lb (73 kg), last menstrual period 08/13/2022, SpO2 99 %.   General: No apparent distress alert and oriented x3 mood and affect normal, dressed appropriately.  HEENT: Pupils equal, extraocular movements intact  Respiratory: Patient's speak in full sentences and does not appear short of breath  Cardiovascular: No lower extremity edema, non tender, no erythema  Low back exam does have some loss of lordosis.  Patient low back exam also has improvement in ROM Negative SLT     Impression and Recommendations:     The above documentation has been reviewed and is accurate and complete Judi Saa, DO

## 2022-08-31 ENCOUNTER — Ambulatory Visit: Payer: Federal, State, Local not specified - PPO | Admitting: Family Medicine

## 2022-08-31 ENCOUNTER — Encounter: Payer: Self-pay | Admitting: Family Medicine

## 2022-08-31 VITALS — BP 112/60 | HR 74 | Ht 68.0 in | Wt 161.0 lb

## 2022-08-31 DIAGNOSIS — G8929 Other chronic pain: Secondary | ICD-10-CM | POA: Diagnosis not present

## 2022-08-31 DIAGNOSIS — M545 Low back pain, unspecified: Secondary | ICD-10-CM

## 2022-08-31 NOTE — Assessment & Plan Note (Addendum)
Low back exam is significantly improved from previous exam.  No longer having the lumbar radiculopathy doing so much better  Stay active No meds Can repeat epidural if needed, already 60% better

## 2022-08-31 NOTE — Patient Instructions (Signed)
Start at 50% for the first week increase to 10% weekly there after Good luck with the horses  Ice after activity  See me again in 8-10 weeks

## 2022-09-05 DIAGNOSIS — R509 Fever, unspecified: Secondary | ICD-10-CM | POA: Diagnosis not present

## 2022-09-05 DIAGNOSIS — Z03818 Encounter for observation for suspected exposure to other biological agents ruled out: Secondary | ICD-10-CM | POA: Diagnosis not present

## 2022-09-05 DIAGNOSIS — J111 Influenza due to unidentified influenza virus with other respiratory manifestations: Secondary | ICD-10-CM | POA: Diagnosis not present

## 2022-09-05 DIAGNOSIS — R059 Cough, unspecified: Secondary | ICD-10-CM | POA: Diagnosis not present

## 2022-09-07 ENCOUNTER — Telehealth: Payer: Self-pay | Admitting: Nurse Practitioner

## 2022-09-09 ENCOUNTER — Encounter: Payer: Self-pay | Admitting: Advanced Practice Midwife

## 2022-09-10 ENCOUNTER — Ambulatory Visit: Payer: Federal, State, Local not specified - PPO | Admitting: Nurse Practitioner

## 2022-09-10 VITALS — BP 118/86 | HR 73 | Temp 97.8°F | Ht 68.0 in | Wt 161.0 lb

## 2022-09-10 DIAGNOSIS — R062 Wheezing: Secondary | ICD-10-CM | POA: Diagnosis not present

## 2022-09-10 MED ORDER — METHYLPREDNISOLONE ACETATE 80 MG/ML IJ SUSP: 80.0000 mg | Freq: Once | INTRAMUSCULAR | Status: DC

## 2022-09-10 NOTE — Assessment & Plan Note (Signed)
Patient here for urgent care follow-up after being diagnosed with influenza.  She did start him flu but then quit due to side effects.  Feeling better but still coughing wheezing appreciated on exam.  Will administer Depo-Medrol 80 mg IM x 1 dose.  Follow-up if no improvement.

## 2022-09-10 NOTE — Progress Notes (Signed)
   Established Patient Office Visit  Subjective   Patient ID: Kerri Moss, female    DOB: [birth year]  Age: 26 y.o. MRN: [id number]  Chief Complaint  Patient presents with   Hospitalization Follow-up    HPI  Urgent care: States taht she went Saturday. States that on Saturday she had a headache and started to develop a fever. States she went to be evaluated in the evening. States dx with the flu. Was told to take tamiflu and otc analgesics. States that she stopped around day 3. States that seh got nausea and felt like she was going to vomit. States that she is better and started feeling better Tuesday into Wednesday  States that she does have an albuterol inhaler that she uses 2 a week.  States she has a history of asthma as a kid but unsure if she grew out of it.  Is taking antihistamines as prescribed.  She denies PND or waking up with shortness of breath at night.      Review of Systems  Constitutional:  Negative for chills and fever.  Respiratory:  Positive for cough. Negative for shortness of breath and wheezing.   Gastrointestinal:  Negative for abdominal pain, constipation, diarrhea, nausea and vomiting.  Musculoskeletal:  Negative for joint pain and myalgias.  Neurological:  Negative for headaches.      Objective:     BP 118/86   Pulse 73   Temp 97.8 F (36.6 C) (Oral)   Ht 5\' 8"  (1.727 m)   Wt 161 lb (73 kg)   LMP 08/13/2022 (Approximate)   SpO2 98%   BMI 24.48 kg/m    Physical Exam Vitals and nursing note reviewed.  Constitutional:      Appearance: Normal appearance.  Cardiovascular:     Rate and Rhythm: Normal rate and regular rhythm.     Heart sounds: Normal heart sounds.  Pulmonary:     Effort: Pulmonary effort is normal.     Breath sounds: Wheezing present.     Comments: RUL, LUL, RLL wheezes and scattered rhonchi Abdominal:     General: Bowel sounds are normal.  Neurological:     Mental Status: She is alert.      No results found for any  visits on 09/10/22.    The ASCVD Risk score (Arnett DK, et al., 2019) failed to calculate for the following reasons:   The 2019 ASCVD risk score is only valid for ages 58 to 27    Assessment & Plan:   Problem List Items Addressed This Visit       Other   Wheezing - Primary    Patient here for urgent care follow-up after being diagnosed with influenza.  She did start him flu but then quit due to side effects.  Feeling better but still coughing wheezing appreciated on exam.  Will administer Depo-Medrol 80 mg IM x 1 dose.  Follow-up if no improvement.      Relevant Medications   methylPREDNISolone acetate (DEPO-MEDROL) injection 80 mg    Return if symptoms worsen or fail to improve.    78, NP

## 2022-09-10 NOTE — Patient Instructions (Signed)
Nice to see you today We gave you a dose of steroids in office today If you continue using the albuterol inhaler 2 or more times a week let me know and we will need to start a daily inhaler.

## 2022-09-25 ENCOUNTER — Encounter: Payer: Self-pay | Admitting: Nurse Practitioner

## 2022-09-25 NOTE — Telephone Encounter (Signed)
Error

## 2022-10-08 ENCOUNTER — Encounter: Payer: Self-pay | Admitting: Advanced Practice Midwife

## 2022-10-08 ENCOUNTER — Ambulatory Visit: Payer: Federal, State, Local not specified - PPO | Admitting: Advanced Practice Midwife

## 2022-10-08 VITALS — BP 107/70 | HR 65 | Wt 156.0 lb

## 2022-10-08 DIAGNOSIS — N941 Unspecified dyspareunia: Secondary | ICD-10-CM | POA: Diagnosis not present

## 2022-10-08 MED ORDER — FLUCONAZOLE 150 MG PO TABS: 150.0000 mg | ORAL_TABLET | Freq: Once | ORAL | 0 refills | Status: AC

## 2022-10-08 NOTE — Progress Notes (Signed)
RGYN here w/ complaints of pain with intercourse.   No irregular bleeding, No Dysuria  STD Screening Declined   LMP: 09/14/22

## 2022-10-09 NOTE — Progress Notes (Signed)
GYNECOLOGY PROBLEM CARE ENCOUNTER NOTE  History:     Kerri Moss is a 27 y.o. G0P0. female here for a routine annual gynecologic exam.  Current complaints: pain during intercourse, recurrent. Patient states that for every 10 episodes of sexual intercourse she will experience pain during 2 or 3 episodes.  Pain is located on the posterior aspect of her vagina. Denies abnormal vaginal bleeding, discharge, pelvic pain, or other gynecologic concerns.   Patient states that her painful episodes of intercourse subsided when she was previously prescribed Diflucan. She denies current abnormal vaginal discharge but requests a PRN prescription.   53 female partner, penetrative sex. Patient's first and only partner.    Gynecologic History Patient's last menstrual period was 09/14/2022. Contraception:  none "pull out method" Last Pap: 07/09/2021. Result was normal with negative HPV (+ chlamydia)   Obstetric History OB History  No obstetric history on file.    Past Medical History:  Diagnosis Date   Car sickness    Chlamydia 06/2021   Environmental allergies     Past Surgical History:  Procedure Laterality Date   DENTAL SURGERY     TONSILLECTOMY AND ADENOIDECTOMY N/A 08/06/2017   Procedure: TONSILLECTOMY AND POSSIBLE ADENOIDECTOMY;  Surgeon: Beverly Gust, MD;  Location: [facility];  Service: ENT;  Laterality: N/A;    Current Outpatient Medications on File Prior to Visit  Medication Sig Dispense Refill   fluticasone (FLONASE) 50 MCG/ACT nasal spray Place 2 sprays into both nostrils daily.     loratadine (CLARITIN) 10 MG tablet Take 10 mg by mouth daily.     cetirizine (ZYRTEC) 10 MG tablet Take 10 mg by mouth daily as needed for allergies or rhinitis.  (Patient not taking: Reported on 08/24/2022)     Current Facility-Administered Medications on File Prior to Visit  Medication Dose Route Frequency Provider Last Rate Last Admin   methylPREDNISolone acetate (DEPO-MEDROL)  injection 80 mg  80 mg Intramuscular Once Michela Pitcher, NP        Allergies  Allergen Reactions   Other Shortness Of Breath and Other (See Comments)    Seasonal allergies- Neb treatments when younger (shots, too)  Severe environmental allergies    Social History:  reports that she has never smoked. She has never used smokeless tobacco. She reports current alcohol use. She reports that she does not use drugs.  Family History  Problem Relation Age of Onset   Anxiety disorder Mother    Obesity Mother    Retinal detachment Mother    Obesity Father    Diabetes Father    Heart disease Father    Anxiety disorder Sister    Obesity Sister    Obesity Maternal Aunt    Retinal detachment Maternal Aunt    Stroke Maternal Grandmother    Seizures Maternal Grandmother    Heart disease Maternal Grandfather    Other Paternal Grandmother        pre diabetic   Diabetes Paternal Grandfather    Heart disease Paternal Grandfather     The following portions of the patient's history were reviewed and updated as appropriate: allergies, current medications, past family history, past medical history, past social history, past surgical history and problem list.  Review of Systems Pertinent items noted in HPI and remainder of comprehensive ROS otherwise negative.  Physical Exam:  BP 107/70   Pulse 65   Wt 156 lb (70.8 kg)   LMP 09/14/2022   BMI 23.72 kg/m  CONSTITUTIONAL: Well-developed, well-nourished female  in no acute distress.  HENT:  Normocephalic, atraumatic, External right and left ear normal.  EYES: Conjunctivae and EOM are normal. Pupils are equal, round, and reactive to light. No scleral icterus.  SKIN: Skin is warm and dry. No rash noted. Not diaphoretic. No erythema. No pallor. MUSCULOSKELETAL: Normal range of motion. No tenderness.  No cyanosis, clubbing, or edema. NEUROLOGIC: Alert and oriented to person, place, and time. Normal reflexes, muscle tone coordination.  PSYCHIATRIC:  Normal mood and affect. Normal behavior. Normal judgment and thought content. CARDIOVASCULAR: Normal heart rate noted, regular rhythm RESPIRATORY: Effort normal, no problems with respiration noted. BREASTS: Declined ABDOMEN: Soft, no distention noted.  No tenderness, rebound or guarding.  PELVIC: Deferred   Assessment and Plan:    1. Dyspareunia, female - EMR reviewed - Patient is in the midst of Ortho treatment for lumbar spine pain and s/p epidural for same - Not currently in physical therapy - Expressed my concern that Diflucan does not typically remedy pain during intercourse - Entirely possible vaginal pain secondary to her ortho concerns - Advised AMB Referral to PT for both back complaints and - Ambulatory referral to Physical Therapy  Mallie Snooks, MSA, MSN, CNM Certified Nurse Midwife, [facility]

## 2022-10-21 ENCOUNTER — Encounter: Payer: Self-pay | Admitting: Family Medicine

## 2022-10-26 ENCOUNTER — Other Ambulatory Visit: Payer: Self-pay

## 2022-10-26 DIAGNOSIS — M5416 Radiculopathy, lumbar region: Secondary | ICD-10-CM

## 2022-10-27 NOTE — Progress Notes (Deleted)
Kerri Moss Phone: [phone] Subjective:    I'm seeing this patient by the request  of:  Michela Pitcher, NP  CC: Low back pain follow-up  [id number]  08/31/2022 Low back exam is significantly improved from previous exam.  No longer having the lumbar radiculopathy doing so much better  Stay active No meds Can repeat epidural if needed, already 60% better   Updated 10/29/2022 Kerri Moss is a 27 y.o. female coming in with complaint of back pain  Patient previously did have an MRI showing the patient did have a L5-S1 disc desiccation with central disc protrusion as well as facet arthropathy at multiple levels of the lumbar spine.  Patient did have an epidural October 30 and is scheduled to have another one tomorrow.    Past Medical History:  Diagnosis Date   Car sickness    Chlamydia 06/2021   Environmental allergies    Past Surgical History:  Procedure Laterality Date   DENTAL SURGERY     TONSILLECTOMY AND ADENOIDECTOMY N/A 08/06/2017   Procedure: TONSILLECTOMY AND POSSIBLE ADENOIDECTOMY;  Surgeon: Beverly Gust, MD;  Location: [facility];  Service: ENT;  Laterality: N/A;   Social History   Socioeconomic History   Marital status: Single    Spouse name: Not on file   Number of children: 0   Years of education: Not on file   Highest education level: High school graduate  Occupational History   Not on file  Tobacco Use   Smoking status: Never   Smokeless tobacco: Never  Vaping Use   Vaping Use: Never used  Substance and Sexual Activity   Alcohol use: Yes    Comment: every other week . less than 6 at a time   Drug use: Never   Sexual activity: Yes    Partners: Male  Other Topics Concern   Not on file  Social History Narrative   Fulltime: Leisure centre manager with [employer]   Social Determinants of Health   Financial Resource Strain: Not on file  Food Insecurity: Not on file  Transportation  Needs: Not on file  Physical Activity: Not on file  Stress: Not on file  Social Connections: Not on file   Allergies  Allergen Reactions   Other Shortness Of Breath and Other (See Comments)    Seasonal allergies- Neb treatments when younger (shots, too)  Severe environmental allergies   Family History  Problem Relation Age of Onset   Anxiety disorder Mother    Obesity Mother    Retinal detachment Mother    Obesity Father    Diabetes Father    Heart disease Father    Anxiety disorder Sister    Obesity Sister    Obesity Maternal Aunt    Retinal detachment Maternal Aunt    Stroke Maternal Grandmother    Seizures Maternal Grandmother    Heart disease Maternal Grandfather    Other Paternal Grandmother        pre diabetic   Diabetes Paternal Grandfather    Heart disease Paternal Grandfather      Current Facility-Administered Medications (Endocrine & Metabolic):    methylPREDNISolone acetate (DEPO-MEDROL) injection 80 mg    Current Outpatient Medications (Respiratory):    cetirizine (ZYRTEC) 10 MG tablet, Take 10 mg by mouth daily as needed for allergies or rhinitis.  (Patient not taking: Reported on 08/24/2022)   fluticasone (FLONASE) 50 MCG/ACT nasal spray, Place 2 sprays into both nostrils daily.  loratadine (CLARITIN) 10 MG tablet, Take 10 mg by mouth daily.          Reviewed prior external information including notes and imaging from  primary care provider As well as notes that were available from care everywhere and other healthcare systems.  Past medical history, social, surgical and family history all reviewed in electronic medical record.  No pertanent information unless stated regarding to the chief complaint.   Review of Systems:  No headache, visual changes, nausea, vomiting, diarrhea, constipation, dizziness, abdominal pain, skin rash, fevers, chills, night sweats, weight loss, swollen lymph nodes, body aches, joint swelling, chest pain, shortness of  breath, mood changes. POSITIVE muscle aches  Objective  Last menstrual period 09/14/2022.   General: No apparent distress alert and oriented x3 mood and affect normal, dressed appropriately.  HEENT: Pupils equal, extraocular movements intact  Respiratory: Patient's speak in full sentences and does not appear short of breath  Cardiovascular: No lower extremity edema, non tender, no erythema      Impression and Recommendations:     The above documentation has been reviewed and is accurate and complete Lyndal Pulley, DO

## 2022-10-29 ENCOUNTER — Ambulatory Visit: Payer: Federal, State, Local not specified - PPO | Admitting: Family Medicine

## 2022-10-30 ENCOUNTER — Ambulatory Visit
Admission: RE | Admit: 2022-10-30 | Discharge: 2022-10-30 | Disposition: A | Discharge status: Home / Self Care | Payer: Federal, State, Local not specified - PPO | Source: Ambulatory Visit | Attending: Family Medicine | Admitting: Family Medicine

## 2022-10-30 DIAGNOSIS — M5416 Radiculopathy, lumbar region: Secondary | ICD-10-CM

## 2022-10-30 DIAGNOSIS — M5136 Other intervertebral disc degeneration, lumbar region: Secondary | ICD-10-CM | POA: Diagnosis not present

## 2022-10-30 MED ORDER — IOPAMIDOL (ISOVUE-M 200) INJECTION 41%
1.0000 mL | Freq: Once | INTRAMUSCULAR | Status: AC
  Administered 2022-10-30: 1 mL via EPIDURAL

## 2022-10-30 MED ORDER — METHYLPREDNISOLONE ACETATE 40 MG/ML INJ SUSP (RADIOLOG
80.0000 mg | Freq: Once | INTRAMUSCULAR | Status: AC
  Administered 2022-10-30: 80 mg via EPIDURAL

## 2022-10-30 NOTE — Discharge Instructions (Signed)

## 2022-11-03 DIAGNOSIS — K080 Exfoliation of teeth due to systemic causes: Secondary | ICD-10-CM | POA: Diagnosis not present

## 2022-12-03 ENCOUNTER — Ambulatory Visit: Payer: Federal, State, Local not specified - PPO | Admitting: Family Medicine

## 2022-12-04 ENCOUNTER — Ambulatory Visit: Payer: Federal, State, Local not specified - PPO | Admitting: Family Medicine

## 2022-12-10 NOTE — Progress Notes (Signed)
  Virtual Visit via Video Note  I connected with Kerri Moss on 12/14/22 at  2:15 PM EDT by a video enabled telemedicine application and verified that I am speaking with the correct person using two identifiers.  Location: Patient: Was alone in an outside facility Provider: In office   I discussed the limitations of evaluation and management by telemedicine and the availability of in person appointments. The patient expressed understanding and agreed to proceed.  History of Present Illness: Patient is a 27 year old female with degenerative disc disease at the L5-S1 who did go under an epidural on February 2.  States that she is feeling 85% better at the moment.  Hopeful that this will continue to make improvement.  Wants to avoid any surgical intervention.  Hoping that this will continue to improve.  Doing the exercises fairly regularly    Observations/Objective: Alert, virtual did not work.  Adjusted to do a phone call   Assessment and Plan: Low back pain with no radicular symptoms at the time.  We discussed the L5-S1.  We discussed the epidural.  Discussed home exercises, patient will follow-up with me on an as-needed basis   Follow Up Instructions:    I discussed the assessment and treatment plan with the patient. The patient was provided an opportunity to ask questions and all were answered. The patient agreed with the plan and demonstrated an understanding of the instructions.   The patient was advised to call back or seek an in-person evaluation if the symptoms worsen or if the condition fails to improve as anticipated.  I provided 8 minutes of non-face-to-face time during this encounter.   Lyndal Pulley, DO

## 2022-12-14 ENCOUNTER — Ambulatory Visit (INDEPENDENT_AMBULATORY_CARE_PROVIDER_SITE_OTHER): Payer: Federal, State, Local not specified - PPO | Admitting: Family Medicine

## 2022-12-14 DIAGNOSIS — M5416 Radiculopathy, lumbar region: Secondary | ICD-10-CM | POA: Diagnosis not present

## 2023-05-20 DIAGNOSIS — N926 Irregular menstruation, unspecified: Secondary | ICD-10-CM | POA: Diagnosis not present

## 2023-07-23 ENCOUNTER — Encounter: Payer: Self-pay | Admitting: Nurse Practitioner

## 2023-07-23 ENCOUNTER — Ambulatory Visit (INDEPENDENT_AMBULATORY_CARE_PROVIDER_SITE_OTHER)
Admission: RE | Admit: 2023-07-23 | Discharge: 2023-07-23 | Disposition: A | Discharge status: Home / Self Care | Payer: Federal, State, Local not specified - PPO | Source: Ambulatory Visit | Attending: Nurse Practitioner

## 2023-07-23 ENCOUNTER — Ambulatory Visit: Payer: Federal, State, Local not specified - PPO | Admitting: Nurse Practitioner

## 2023-07-23 VITALS — BP 110/72 | HR 57 | Temp 97.6°F | Ht 68.0 in | Wt 157.6 lb

## 2023-07-23 DIAGNOSIS — L989 Disorder of the skin and subcutaneous tissue, unspecified: Secondary | ICD-10-CM | POA: Diagnosis not present

## 2023-07-23 DIAGNOSIS — Z23 Encounter for immunization: Secondary | ICD-10-CM

## 2023-07-23 DIAGNOSIS — M25551 Pain in right hip: Secondary | ICD-10-CM

## 2023-07-23 NOTE — Addendum Note (Signed)
Addended by: Melina Copa on: 07/23/2023 04:15 PM   Modules accepted: Orders

## 2023-07-23 NOTE — Assessment & Plan Note (Signed)
See clinical photo if x-ray unrevealing dermatology referral

## 2023-07-23 NOTE — Patient Instructions (Addendum)
Nice to see you today I will be in touch with the labs once I have them Follow up with me in 3 months for your physical and labs

## 2023-07-23 NOTE — Progress Notes (Signed)
   Acute Office Visit  Subjective:     Patient ID: Kerri Moss, female    DOB: Feb 22, 1996, 27 y.o.   MRN: [id number]  Chief Complaint  Patient presents with   skin indent    Pt complains of indention on right side of hip. Appears to be discolored. No pain in area but pain on lower right side of hip. Pt states she noticed a month ago and has grown in size.    Flu Vaccine    HPI Patient is in today for skin issue with a history of subdural hematoma, temporal fracture,somatic dysfunction of the lumbar spine.   States that she noticed it approx 1.5 months ago without injury.States that it does not itch burn or have discharge. No history of skin issues in the past. States that she will get a rash if she does not take her allergy medications. States that the size has increased as of late   States that the surrounding mucsles can be senessitve to touch but not movement. No numbness/tingling or weakness.    Review of Systems  Constitutional:  Negative for chills and fever.  Respiratory:  Negative for shortness of breath.   Cardiovascular:  Negative for chest pain.  Musculoskeletal:  Positive for joint pain.  Skin:  Positive for rash.  Neurological:  Negative for headaches.  Psychiatric/Behavioral:  Negative for hallucinations and suicidal ideas.         Objective:    BP 110/72   Pulse (!) 57   Temp 97.6 F (36.4 C) (Oral)   Ht 5\' 8"  (1.727 m)   Wt 157 lb 9.6 oz (71.5 kg)   SpO2 99%   BMI 23.96 kg/m    Physical Exam Vitals and nursing note reviewed.  Constitutional:      Appearance: Normal appearance.  Cardiovascular:     Rate and Rhythm: Normal rate and regular rhythm.     Pulses:          Posterior tibial pulses are 1+ on the right side and 1+ on the left side.     Heart sounds: Normal heart sounds.  Pulmonary:     Effort: Pulmonary effort is normal.     Breath sounds: Normal breath sounds.  Musculoskeletal:     Lumbar back: Negative right straight leg raise  test and negative left straight leg raise test.     Right lower leg: Tenderness present. No edema.     Left lower leg: No edema.       Legs:  Skin:    Comments: See clinical photo  Neurological:     Mental Status: She is alert.     No results found for any visits on 07/23/23.      Assessment & Plan:   Problem List Items Addressed This Visit       Musculoskeletal and Integument   Skin lesion    See clinical photo if x-ray unrevealing dermatology referral        Other   Right hip pain - Primary    Some tenderness inferiorly to the skin lesion.  Will do x-ray of the hip pending result.  No red flag symptoms on exam      Relevant Orders   DG HIP UNILAT WITH PELVIS 2-3 VIEWS RIGHT    No orders of the defined types were placed in this encounter.   Return in about 3 months (around 10/23/2023) for CPE and Labs.  Audria Nine, NP

## 2023-07-23 NOTE — Assessment & Plan Note (Signed)
Some tenderness inferiorly to the skin lesion.  Will do x-ray of the hip pending result.  No red flag symptoms on exam

## 2023-08-11 ENCOUNTER — Encounter: Payer: Self-pay | Admitting: Nurse Practitioner

## 2023-08-12 DIAGNOSIS — K011 Impacted teeth: Secondary | ICD-10-CM | POA: Diagnosis not present

## 2023-09-14 DIAGNOSIS — K011 Impacted teeth: Secondary | ICD-10-CM | POA: Diagnosis not present

## 2023-09-14 DIAGNOSIS — K136 Irritative hyperplasia of oral mucosa: Secondary | ICD-10-CM | POA: Diagnosis not present

## 2023-12-28 ENCOUNTER — Ambulatory Visit: Admitting: Podiatry

## 2023-12-28 ENCOUNTER — Encounter: Payer: Self-pay | Admitting: Podiatry

## 2023-12-28 DIAGNOSIS — D2372 Other benign neoplasm of skin of left lower limb, including hip: Secondary | ICD-10-CM

## 2023-12-28 DIAGNOSIS — D2371 Other benign neoplasm of skin of right lower limb, including hip: Secondary | ICD-10-CM | POA: Diagnosis not present

## 2023-12-28 NOTE — Progress Notes (Signed)
 She presents today concerned that she may be experiencing the same wart growth that she had previously.  Objective: Vital signs are stable alert oriented x 3.  Pulses are palpable.  She has multiple benign porokeratotic lesions to the forefoot bilaterally.  Assessment: Benign skin lesions forefoot bilateral.  Plan: Debridement of benign skin lesions.
